# Patient Record
Sex: Male | Born: 1970 | Race: Black or African American | Hispanic: No | Marital: Single | Smoking: Current every day smoker
Health system: Southern US, Community
[De-identification: ages and names within clinical notes are randomized; demographics above are authoritative.]

## PROBLEM LIST (undated history)

## (undated) DIAGNOSIS — I10 Essential (primary) hypertension: Secondary | ICD-10-CM

---

## 2010-09-19 ENCOUNTER — Emergency Department (HOSPITAL_COMMUNITY): Admission: EM | Admit: 2010-09-19 | Discharge: 2010-09-19 | Payer: Self-pay | Admitting: Family Medicine

## 2015-10-07 ENCOUNTER — Encounter (HOSPITAL_COMMUNITY): Payer: Self-pay | Admitting: *Deleted

## 2015-10-07 ENCOUNTER — Emergency Department (HOSPITAL_COMMUNITY)
Admission: EM | Admit: 2015-10-07 | Discharge: 2015-10-07 | Disposition: A | Discharge status: Home / Self Care | Payer: Self-pay | Attending: Emergency Medicine | Admitting: Emergency Medicine

## 2015-10-07 DIAGNOSIS — I10 Essential (primary) hypertension: Secondary | ICD-10-CM | POA: Insufficient documentation

## 2015-10-07 DIAGNOSIS — M25552 Pain in left hip: Secondary | ICD-10-CM | POA: Insufficient documentation

## 2015-10-07 DIAGNOSIS — Z720 Tobacco use: Secondary | ICD-10-CM | POA: Insufficient documentation

## 2015-10-07 DIAGNOSIS — R202 Paresthesia of skin: Secondary | ICD-10-CM | POA: Insufficient documentation

## 2015-10-07 DIAGNOSIS — M5416 Radiculopathy, lumbar region: Secondary | ICD-10-CM | POA: Insufficient documentation

## 2015-10-07 HISTORY — DX: Essential (primary) hypertension: I10

## 2015-10-07 MED ORDER — OXYCODONE-ACETAMINOPHEN 5-325 MG PO TABS
1.0000 | ORAL_TABLET | Freq: Once | ORAL | Status: AC
  Administered 2015-10-07: 1 via ORAL
  Filled 2015-10-07: qty 1

## 2015-10-07 MED ORDER — NAPROXEN 500 MG PO TABS: 500.0000 mg | ORAL_TABLET | Freq: Two times a day (BID) | ORAL | Status: DC

## 2015-10-07 MED ORDER — METHOCARBAMOL 500 MG PO TABS: 500.0000 mg | ORAL_TABLET | Freq: Two times a day (BID) | ORAL | Status: DC

## 2015-10-07 MED ORDER — DEXAMETHASONE SODIUM PHOSPHATE 10 MG/ML IJ SOLN
10.0000 mg | Freq: Once | INTRAMUSCULAR | Status: AC
  Administered 2015-10-07: 10 mg via INTRAMUSCULAR
  Filled 2015-10-07: qty 1

## 2015-10-07 MED ORDER — PREDNISONE 20 MG PO TABS: 40.0000 mg | ORAL_TABLET | Freq: Every day | ORAL | Status: DC

## 2015-10-07 MED ORDER — HYDROCODONE-ACETAMINOPHEN 5-325 MG PO TABS: 1.0000 | ORAL_TABLET | Freq: Four times a day (QID) | ORAL | Status: DC | PRN

## 2015-10-07 NOTE — ED Notes (Signed)
Pt is here with lower back pain for 6months and reports a lump.

## 2015-10-07 NOTE — Discharge Instructions (Signed)
Naprosyn for pain and inflammation. norco for severe pain only. Prednisone until all gone. Robaxin for spasms. Follow up with your primary care doctor.    Lumbosacral Radiculopathy Lumbosacral radiculopathy is a condition that involves the spinal nerves and nerve roots in the low back and bottom of the spine. The condition develops when these nerves and nerve roots move out of place or become inflamed and cause symptoms. CAUSES This condition may be caused by: 1. Pressure from a disk that bulges out of place (herniated disk). A disk is a plate of cartilage that separates bones in the spine. 2. Disk degeneration. 3. A narrowing of the bones of the lower back (spinal stenosis). 4. A tumor. 5. An infection. 6. An injury that places sudden pressure on the disks that cushion the bones of your lower spine. RISK FACTORS This condition is more likely to develop in: 1. Males aged 30-50 years. 2. Females aged 50-60 years. 3. People who lift improperly. 4. People who are overweight or live a sedentary lifestyle. 5. People who smoke. 6. People who perform repetitive activities that strain the spine. SYMPTOMS Symptoms of this condition include: 1. Pain that goes down from the back into the legs (sciatica). This is the most common symptom. The pain may be worse with sitting, coughing, or sneezing. 2. Pain and numbness in the arms and legs. 3. Muscle weakness. 4. Tingling. 5. Loss of bladder control or bowel control. DIAGNOSIS This condition is diagnosed with a physical exam and medical history. If the pain is lasting, you may have tests, such as: 1. MRI scan. 2. X-ray. 3. CT scan. 4. Myelogram. 5. Nerve conduction study. TREATMENT This condition is often treated with: 1. Hot packs and ice applied to affected areas. 2. Stretches to improve flexibility. 3. Exercises to strengthen back muscles. 4. Physical therapy. 5. Pain medicine. 6. A steroid injection in the spine. In some cases, no  treatment is needed. If the condition is long-lasting (chronic), or if symptoms are severe, treatment may involve surgery or lifestyle changes, such as following a weight loss plan. HOME CARE INSTRUCTIONS Medicines 1. Take medicines only as directed by your health care provider. 2. Do not drive or operate heavy machinery while taking pain medicine. Injury Care 1. Apply a heat pack to the injured area as directed by your health care provider. 2. Apply ice to the affected area: 1. Put ice in a plastic bag. 2. Place a towel between your skin and the bag. 3. Leave the ice on for 20-30 minutes, every 2 hours while you are awake or as needed. Or, leave the ice on for as long as directed by your health care provider. Other Instructions  If you were shown how to do any exercises or stretches, do them as directed by your health care provider.  If your health care provider prescribed a diet or exercise program, follow it as directed.  Keep all follow-up visits as directed by your health care provider. This is important. SEEK MEDICAL CARE IF:  Your pain does not improve over time even when taking pain medicines. SEEK IMMEDIATE MEDICAL CARE IF:  Your develop severe pain.  Your pain suddenly gets worse.  You develop increasing weakness in your legs.  You lose the ability to control your bladder or bowel.  You have difficulty walking or balancing.  You have a fever.   This information is not intended to replace advice given to you by your health care provider. Make sure you discuss any questions  you have with your health care provider.   Document Released: 11/24/2005 Document Revised: 04/10/2015 Document Reviewed: 11/20/2014 Elsevier Interactive Patient Education 2016 [employer]  Back Exercises If you have pain in your back, do these exercises 2-3 times each day or as told by your doctor. When the pain goes away, do the exercises once each day, but repeat the steps more times for each  exercise (do more repetitions). If you do not have pain in your back, do these exercises once each day or as told by your doctor. EXERCISES Single Knee to Chest Do these steps 3-5 times in a row for each leg: 7. Lie on your back on a firm bed or the floor with your legs stretched out. 8. Bring one knee to your chest. 9. Hold your knee to your chest by grabbing your knee or thigh. 10. Pull on your knee until you feel a gentle stretch in your lower back. 11. Keep doing the stretch for 10-30 seconds. 12. Slowly let go of your leg and straighten it. Pelvic Tilt Do these steps 5-10 times in a row: 7. Lie on your back on a firm bed or the floor with your legs stretched out. 8. Bend your knees so they point up to the ceiling. Your feet should be flat on the floor. 9. Tighten your lower belly (abdomen) muscles to press your lower back against the floor. This will make your tailbone point up to the ceiling instead of pointing down to your feet or the floor. 10. Stay in this position for 5-10 seconds while you gently tighten your muscles and breathe evenly. Cat-Cow Do these steps until your lower back bends more easily: 6. Get on your hands and knees on a firm surface. Keep your hands under your shoulders, and keep your knees under your hips. You may put padding under your knees. 7. Let your head hang down, and make your tailbone point down to the floor so your lower back is round like the back of a cat. 8. Stay in this position for 5 seconds. 9. Slowly lift your head and make your tailbone point up to the ceiling so your back hangs low (sags) like the back of a cow. 10. Stay in this position for 5 seconds. Press-Ups Do these steps 5-10 times in a row: 6. Lie on your belly (face-down) on the floor. 7. Place your hands near your head, about shoulder-width apart. 8. While you keep your back relaxed and keep your hips on the floor, slowly straighten your arms to raise the top half of your body and lift  your shoulders. Do not use your back muscles. To make yourself more comfortable, you may change where you place your hands. 9. Stay in this position for 5 seconds. 10. Slowly return to lying flat on the floor. Bridges Do these steps 10 times in a row: 7. Lie on your back on a firm surface. 8. Bend your knees so they point up to the ceiling. Your feet should be flat on the floor. 9. Tighten your butt muscles and lift your butt off of the floor until your waist is almost as high as your knees. If you do not feel the muscles working in your butt and the back of your thighs, slide your feet 1-2 inches farther away from your butt. 10. Stay in this position for 3-5 seconds. 11. Slowly lower your butt to the floor, and let your butt muscles relax. If this exercise is too easy, try doing  it with your arms crossed over your chest. Belly Crunches Do these steps 5-10 times in a row: 3. Lie on your back on a firm bed or the floor with your legs stretched out. 4. Bend your knees so they point up to the ceiling. Your feet should be flat on the floor. 5. Cross your arms over your chest. 6. Tip your chin a little bit toward your chest but do not bend your neck. 7. Tighten your belly muscles and slowly raise your chest just enough to lift your shoulder blades a tiny bit off of the floor. 8. Slowly lower your chest and your head to the floor. Back Lifts Do these steps 5-10 times in a row: 3. Lie on your belly (face-down) with your arms at your sides, and rest your forehead on the floor. 4. Tighten the muscles in your legs and your butt. 5. Slowly lift your chest off of the floor while you keep your hips on the floor. Keep the back of your head in line with the curve in your back. Look at the floor while you do this. 6. Stay in this position for 3-5 seconds. 7. Slowly lower your chest and your face to the floor. GET HELP IF:  Your back pain gets a lot worse when you do an exercise.  Your back pain does  not lessen 2 hours after you exercise. If you have any of these problems, stop doing the exercises. Do not do them again unless your doctor says it is okay. GET HELP RIGHT AWAY IF:  You have sudden, very bad back pain. If this happens, stop doing the exercises. Do not do them again unless your doctor says it is okay.   This information is not intended to replace advice given to you by your health care provider. Make sure you discuss any questions you have with your health care provider.   Document Released: 12/27/2010 Document Revised: 08/15/2015 Document Reviewed: 01/18/2015 Elsevier Interactive Patient Education [employer]

## 2015-10-07 NOTE — ED Notes (Signed)
Declined W/C at D/C and was escorted to lobby by RN.Declined W/C at D/C and was escorted to lobby by RN. 

## 2015-10-07 NOTE — ED Provider Notes (Signed)
CSN: [id number]     Arrival date & time 10/07/15  1734 History   First MD Initiated Contact with Patient 10/07/15 1801     Chief Complaint  Patient presents with  . Back Pain     (Consider location/radiation/quality/duration/timing/severity/associated sxs/prior Treatment) HPI Timothy Mcdowell is a 44 y.o. male with hx of htn, presents to ed was complaint of back pain. Patient states his back pain started several years ago. He states it worsened in the last 6 months and again in the last 3 days. He states that he hasn't been able to walk the last 2 days until this morning. He states pain is in the lower back and radiates into the left leg. He denies any numbness or weakness in his legs. He states he does have some tingling in that left leg which improves with walking. He denies any saddle anesthesia. Denies any trouble urinating or loss of bowel control. He denies any recent injuries. He denies any fever or chills. No IV drug use. [place] and is unfamiliar with providers in this area. Patient states he is taking Tylenol for his pain with no relief of his symptoms.=  Past Medical History  Diagnosis Date  . Hypertension    History reviewed. No pertinent past surgical history. No family history on file. Social History  Substance Use Topics  . Smoking status: Current Every Day Smoker  . Smokeless tobacco: None  . Alcohol Use: No    Review of Systems  Constitutional: Negative for fever and chills.  Respiratory: Negative for cough, chest tightness and shortness of breath.   Cardiovascular: Negative for chest pain, palpitations and leg swelling.  Gastrointestinal: Negative for nausea, vomiting, abdominal pain, diarrhea and abdominal distention.  Genitourinary: Negative for dysuria, urgency, frequency and hematuria.  Musculoskeletal: Positive for back pain and arthralgias. Negative for myalgias, neck pain and neck stiffness.  Skin: Negative for rash.  Allergic/Immunologic:  Negative for immunocompromised state.  Neurological: Negative for dizziness, weakness, light-headedness, numbness and headaches.  All other systems reviewed and are negative.     Allergies  Other  Home Medications   Prior to Admission medications   Not on File   BP 153/89 mmHg  Pulse 88  Temp(Src) 98.2 F (36.8 C) (Oral)  Resp 18  SpO2 100% Physical Exam  Constitutional: He is oriented to person, place, and time. He appears well-developed and well-nourished. No distress.  HENT:  Head: Normocephalic and atraumatic.  Eyes: Conjunctivae are normal.  Neck: Neck supple.  Cardiovascular: Normal rate, regular rhythm and normal heart sounds.   Pulmonary/Chest: Effort normal. No respiratory distress. He has no wheezes. He has no rales.  Abdominal: Soft. Bowel sounds are normal. He exhibits no distension. There is no tenderness. There is no rebound.  Musculoskeletal: He exhibits no edema.  No midline lumbar spine tenderness. Tender to palpation left SI joint and left buttock. Pain with left straight leg raise. Dorsal pedal pulses are intact and equal bilaterally.  Neurological: He is alert and oriented to person, place, and time.  5/5 and equal lower extremity strength. 2+ and equal patellar reflexes bilaterally. Pt able to dorsiflex bilateral toes and feet with good strength against resistance. Equal sensation bilaterally over thighs and lower legs.   Skin: Skin is warm and dry.  Nursing note and vitals reviewed.   ED Course  Procedures (including critical care time) Labs Review Labs Reviewed - No data to display  Imaging Review No results found. I have personally reviewed  and evaluated these images and lab results as part of my medical decision-making.   EKG Interpretation None      MDM   Final diagnoses:  Lumbar radiculopathy   Patient with lower back pain radiating into the left leg. Normal neurological exam with no red flags to suggest cauda equina. Most likely  lumbar sacral radiculopathy. Patient is ambulatory. Treated emergency department Decadron 10 mg IM and Percocet one tablet. Will discharge home with Robaxin, Norco, naproxen, prednisone burst. Will give her referrals to community clinic. Return precautions discussed.   Filed Vitals:   10/07/15 1758  BP: 153/89  Pulse: 88  Temp: 98.2 F (36.8 C)  TempSrc: Oral  Resp: 18  SpO2: 100%      Jaynie Crumble, PA-C 10/07/15 2101  Azalia Bilis, MD 10/07/15 2154

## 2015-10-09 ENCOUNTER — Emergency Department (HOSPITAL_COMMUNITY)
Admission: EM | Admit: 2015-10-09 | Discharge: 2015-10-09 | Disposition: A | Discharge status: Home / Self Care | Payer: Self-pay | Attending: Emergency Medicine | Admitting: Emergency Medicine

## 2015-10-09 ENCOUNTER — Encounter (HOSPITAL_COMMUNITY): Payer: Self-pay | Admitting: Emergency Medicine

## 2015-10-09 DIAGNOSIS — Y998 Other external cause status: Secondary | ICD-10-CM | POA: Insufficient documentation

## 2015-10-09 DIAGNOSIS — Y9389 Activity, other specified: Secondary | ICD-10-CM | POA: Insufficient documentation

## 2015-10-09 DIAGNOSIS — S61011A Laceration without foreign body of right thumb without damage to nail, initial encounter: Secondary | ICD-10-CM | POA: Insufficient documentation

## 2015-10-09 DIAGNOSIS — Z7952 Long term (current) use of systemic steroids: Secondary | ICD-10-CM | POA: Insufficient documentation

## 2015-10-09 DIAGNOSIS — W260XXA Contact with knife, initial encounter: Secondary | ICD-10-CM | POA: Insufficient documentation

## 2015-10-09 DIAGNOSIS — T148XXA Other injury of unspecified body region, initial encounter: Secondary | ICD-10-CM

## 2015-10-09 DIAGNOSIS — Z720 Tobacco use: Secondary | ICD-10-CM | POA: Insufficient documentation

## 2015-10-09 DIAGNOSIS — IMO0002 Reserved for concepts with insufficient information to code with codable children: Secondary | ICD-10-CM

## 2015-10-09 DIAGNOSIS — Y9289 Other specified places as the place of occurrence of the external cause: Secondary | ICD-10-CM | POA: Insufficient documentation

## 2015-10-09 DIAGNOSIS — Z791 Long term (current) use of non-steroidal anti-inflammatories (NSAID): Secondary | ICD-10-CM | POA: Insufficient documentation

## 2015-10-09 DIAGNOSIS — I10 Essential (primary) hypertension: Secondary | ICD-10-CM | POA: Insufficient documentation

## 2015-10-09 DIAGNOSIS — Z79899 Other long term (current) drug therapy: Secondary | ICD-10-CM | POA: Insufficient documentation
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
## 2015-10-09 NOTE — Discharge Instructions (Signed)
Please gently clean and dress your wound once a day. Return for signs of infection to include pus, redness, heat, and severe tenderness.

## 2016-05-07 ENCOUNTER — Encounter (HOSPITAL_COMMUNITY): Payer: Self-pay | Admitting: Emergency Medicine

## 2016-05-07 ENCOUNTER — Emergency Department (HOSPITAL_COMMUNITY)
Admission: EM | Admit: 2016-05-07 | Discharge: 2016-05-07 | Disposition: A | Discharge status: Home / Self Care | Payer: Self-pay | Attending: Emergency Medicine | Admitting: Emergency Medicine

## 2016-05-07 ENCOUNTER — Emergency Department (HOSPITAL_COMMUNITY): Payer: Self-pay

## 2016-05-07 DIAGNOSIS — Z79899 Other long term (current) drug therapy: Secondary | ICD-10-CM | POA: Insufficient documentation

## 2016-05-07 DIAGNOSIS — Y929 Unspecified place or not applicable: Secondary | ICD-10-CM | POA: Insufficient documentation

## 2016-05-07 DIAGNOSIS — S61512A Laceration without foreign body of left wrist, initial encounter: Secondary | ICD-10-CM | POA: Insufficient documentation

## 2016-05-07 DIAGNOSIS — I10 Essential (primary) hypertension: Secondary | ICD-10-CM | POA: Insufficient documentation

## 2016-05-07 DIAGNOSIS — Y939 Activity, unspecified: Secondary | ICD-10-CM | POA: Insufficient documentation

## 2016-05-07 DIAGNOSIS — Y999 Unspecified external cause status: Secondary | ICD-10-CM | POA: Insufficient documentation

## 2016-05-07 DIAGNOSIS — Z23 Encounter for immunization: Secondary | ICD-10-CM | POA: Insufficient documentation

## 2016-05-07 DIAGNOSIS — IMO0002 Reserved for concepts with insufficient information to code with codable children: Secondary | ICD-10-CM

## 2016-05-07 DIAGNOSIS — F17200 Nicotine dependence, unspecified, uncomplicated: Secondary | ICD-10-CM | POA: Insufficient documentation

## 2016-05-07 MED ORDER — LIDOCAINE HCL 2 % IJ SOLN
20.0000 mL | Freq: Once | INTRAMUSCULAR | Status: AC
  Administered 2016-05-07: 400 mg via INTRADERMAL
  Filled 2016-05-07: qty 20

## 2016-05-07 MED ORDER — TETANUS-DIPHTH-ACELL PERTUSSIS 5-2.5-18.5 LF-MCG/0.5 IM SUSP
0.5000 mL | Freq: Once | INTRAMUSCULAR | Status: AC
  Administered 2016-05-07: 0.5 mL via INTRAMUSCULAR
  Filled 2016-05-07: qty 0.5

## 2016-05-07 MED ORDER — MORPHINE SULFATE (PF) 4 MG/ML IV SOLN
4.0000 mg | Freq: Once | INTRAVENOUS | Status: AC
  Administered 2016-05-07: 4 mg via INTRAMUSCULAR
  Filled 2016-05-07: qty 1
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
## 2016-05-07 NOTE — Discharge Instructions (Signed)
Your tetanus shot was updated.  We repaired your laceration with 11 sutures, these need to be removed in 10-14 days.  Take ibuprofen or Tylenol for pain.  Return to the ED if you experience fever, increased swelling, redness, or foul smelling drainage from your wound.   Laceration Care, Adult   A laceration is a cut that goes through all of the layers of the skin and into the tissue that is right under the skin. Some lacerations heal on their own. Others need to be closed with stitches (sutures), staples, skin adhesive strips, or skin glue. Proper laceration care minimizes the risk of infection and helps the laceration to heal better.  HOW TO CARE FOR YOUR LACERATION  If sutures or staples were used:  Keep the wound clean and dry.  If you were given a bandage (dressing), you should change it at least one time per day or as told by your health care provider. You should also change it if it becomes wet or dirty.  Keep the wound completely dry for the first 24 hours or as told by your health care provider. After that time, you may shower or bathe. However, make sure that the wound is not soaked in water until after the sutures or staples have been removed.  Clean the wound one time each day or as told by your health care provider:  Wash the wound with soap and water.  Rinse the wound with water to remove all soap.  Pat the wound dry with a clean towel. Do not rub the wound. After cleaning the wound, apply a thin layer of antibiotic ointment as told by your health care provider. This will help to prevent infection and keep the dressing from sticking to the wound.  Have the sutures or staples removed as told by your health care provider. If skin adhesive strips were used:  Keep the wound clean and dry.  If you were given a bandage (dressing), you should change it at least one time per day or as told by your health care provider. You should also change it if it becomes dirty or wet.  Do not get the skin  adhesive strips wet. You may shower or bathe, but be careful to keep the wound dry.  If the wound gets wet, pat it dry with a clean towel. Do not rub the wound.  Skin adhesive strips fall off on their own. You may trim the strips as the wound heals. Do not remove skin adhesive strips that are still stuck to the wound. They will fall off in time. If skin glue was used:  Try to keep the wound dry, but you may briefly wet it in the shower or bath. Do not soak the wound in water, such as by swimming.  After you have showered or bathed, gently pat the wound dry with a clean towel. Do not rub the wound.  Do not do any activities that will make you sweat heavily until the skin glue has fallen off on its own.  Do not apply liquid, cream, or ointment medicine to the wound while the skin glue is in place. Using those may loosen the film before the wound has healed.  If you were given a bandage (dressing), you should change it at least one time per day or as told by your health care provider. You should also change it if it becomes dirty or wet.  If a dressing is placed over the wound, be careful not to  apply tape directly over the skin glue. Doing that may cause the glue to be pulled off before the wound has healed.  Do not pick at the glue. The skin glue usually remains in place for 5-10 days, then it falls off of the skin. General Instructions  Take over-the-counter and prescription medicines only as told by your health care provider.  If you were prescribed an antibiotic medicine or ointment, take or apply it as told by your doctor. Do not stop using it even if your condition improves.  To help prevent scarring, make sure to cover your wound with sunscreen whenever you are outside after stitches are removed, after adhesive strips are removed, or when glue remains in place and the wound is healed. Make sure to wear a sunscreen of at least 30 SPF.  Do not scratch or pick at the wound.  Keep all follow-up  visits as told by your health care provider. This is important.  Check your wound every day for signs of infection. Watch for:  Redness, swelling, or pain.  Fluid, blood, or pus. Raise (elevate) the injured area above the level of your heart while you are sitting or lying down, if possible. SEEK MEDICAL CARE IF:  You received a tetanus shot and you have swelling, severe pain, redness, or bleeding at the injection site.  You have a fever.  A wound that was closed breaks open.  You notice a bad smell coming from your wound or your dressing.  You notice something coming out of the wound, such as wood or glass.  Your pain is not controlled with medicine.  You have increased redness, swelling, or pain at the site of your wound.  You have fluid, blood, or pus coming from your wound.  You notice a change in the color of your skin near your wound.  You need to change the dressing frequently due to fluid, blood, or pus draining from the wound.  You develop a new rash.  You develop numbness around the wound. SEEK IMMEDIATE MEDICAL CARE IF:  You develop severe swelling around the wound.  Your pain suddenly increases and is severe.  You develop painful lumps near the wound or on skin that is anywhere on your body.  You have a red streak going away from your wound.  The wound is on your hand or foot and you cannot properly move a finger or toe.  The wound is on your hand or foot and you notice that your fingers or toes look pale or bluish. This information is not intended to replace advice given to you by your health care provider. Make sure you discuss any questions you have with your health care provider.  Document Released: 11/24/2005 Document Revised: 04/10/2015 Document Reviewed: 11/20/2014  Elsevier Interactive Patient Education [employer]
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
## 2016-05-20 ENCOUNTER — Emergency Department (HOSPITAL_COMMUNITY)
Admission: EM | Admit: 2016-05-20 | Discharge: 2016-05-20 | Disposition: A | Discharge status: Home / Self Care | Payer: Self-pay | Attending: Emergency Medicine | Admitting: Emergency Medicine

## 2016-05-20 ENCOUNTER — Encounter (HOSPITAL_COMMUNITY): Payer: Self-pay

## 2016-05-20 DIAGNOSIS — Z4802 Encounter for removal of sutures: Secondary | ICD-10-CM | POA: Insufficient documentation

## 2016-05-20 DIAGNOSIS — Z79899 Other long term (current) drug therapy: Secondary | ICD-10-CM | POA: Insufficient documentation

## 2016-05-20 DIAGNOSIS — I10 Essential (primary) hypertension: Secondary | ICD-10-CM | POA: Insufficient documentation

## 2016-05-20 DIAGNOSIS — F17200 Nicotine dependence, unspecified, uncomplicated: Secondary | ICD-10-CM | POA: Insufficient documentation

## 2016-05-20 NOTE — ED Notes (Signed)
Patient here to have stitches removed from left hand, no complaints

## 2016-05-20 NOTE — ED Provider Notes (Signed)
CSN: [id number]     Arrival date & time 05/20/16  1237 History   By signing my name below, I, Marisue HumbleMichelle Chaffee, attest that this documentation has been prepared under the direction and in the presence of non-physician practitioner, Cheri FowlerKayla Neveyah Garzon, PA-C. Electronically Signed: Marisue HumbleMichelle Chaffee, Scribe. 05/20/2016. 2:12 PM.    Chief Complaint  Patient presents with  . Suture / Staple Removal    The history is provided by the patient. No language interpreter was used.   HPI Comments:  Timothy Mcdowell is a 45 y.o. male who presents to the Emergency Department for suture removal from laceration to left palm. Laceration was repaired in the ED 13 days ago. He states the wound has been healing well. Pt has been cleaning the wound with peroxide 4-5 times per day. Denies fever, chills, redness, pain, or drainage from wound.   Past Medical History  Diagnosis Date  . Hypertension    History reviewed. No pertinent past surgical history. Family History  Problem Relation Age of Onset  . Hypertension Mother    Social History  Substance Use Topics  . Smoking status: Current Every Day Smoker  . Smokeless tobacco: None  . Alcohol Use: No    Review of Systems  Constitutional: Negative for fever and chills.  Skin: Positive for wound.    Allergies  Other  Home Medications   Prior to Admission medications   Medication Sig Start Date End Date Taking? Authorizing Provider  cholecalciferol (VITAMIN D) 1000 units tablet Take 1,000 Units by mouth daily.    Historical Provider, MD   BP 136/76 mmHg  Pulse 82  Temp(Src) 98 F (36.7 C) (Oral)  Resp 18  SpO2 98%   Physical Exam  Constitutional: He is oriented to person, place, and time. He appears well-developed and well-nourished.  HENT:  Head: Normocephalic and atraumatic.  Right Ear: External ear normal.  Left Ear: External ear normal.  Eyes: Conjunctivae are normal. No scleral icterus.  Neck: No tracheal deviation present.  Cardiovascular:   Pulses:      Radial pulses are 2+ on the right side, and 2+ on the left side.  Brisk cap refill.   Pulmonary/Chest: Effort normal. No respiratory distress.  Abdominal: He exhibits no distension.  Musculoskeletal: Normal range of motion.  Neurological: He is alert and oriented to person, place, and time.  Strength and sensation intact.  Skin: Skin is warm and dry.  Well healing 6 cm laceration without signs of infection on right palm with 11 sutures in place.   Psychiatric: He has a normal mood and affect. His behavior is normal.    ED Course  Procedures   SUTURE REMOVAL Performed by: Cheri FowlerKayla Bernestine Holsapple  Consent: Verbal consent obtained. Patient identity confirmed: provided demographic data Time out: Immediately prior to procedure a "time out" was called to verify the correct patient, procedure, equipment, support staff and site/side marked as required.  Location details: right palm  Wound Appearance: clean  Sutures/Staples Removed: 11  Facility: sutures placed in this facility Patient tolerance: Patient tolerated the procedure well with no immediate complications.    DIAGNOSTIC STUDIES:  Oxygen Saturation is 98% on RA, normal by my interpretation.    COORDINATION OF CARE:  2:07 PM Removed 11 stitches from left hand. Will dress wound with steri strips and discharge. Discussed treatment plan with pt at bedside and pt agreed to plan.  Labs Review Labs Reviewed - No data to display  Imaging Review No results found. I have personally reviewed and  evaluated these images and lab results as part of my medical decision-making.   EKG Interpretation None      MDM   Final diagnoses:  Encounter for removal of sutures   Wound without signs of infection.  Sutures removed.  Steri strips applied.  Advised patient to avoid hydrogen peroxide as this inhibits wound healing.  VSS, NAD.  Discussed return precautions.  Follow up PCP as needed.  Patient agrees and acknowledges the above  plan for discharge.   I personally performed the services described in this documentation, which was scribed in my presence. The recorded information has been reviewed and is accurate.    Cheri Fowler, PA-C 05/20/16 1417  Pricilla Loveless, MD 05/23/16 [phone]

## 2016-05-20 NOTE — ED Notes (Signed)
Suture removal kit at bedside 

## 2016-05-20 NOTE — Discharge Instructions (Signed)

## 2016-05-20 NOTE — ED Notes (Signed)
Velcro splint applied to left wrist at pt's request. Steri strips applied.

## 2016-05-20 NOTE — ED Notes (Signed)
Sutures intact in left hand from 5/31. Small part of wound pulled apart but no signs of infection.

## 2016-08-11 ENCOUNTER — Emergency Department (HOSPITAL_BASED_OUTPATIENT_CLINIC_OR_DEPARTMENT_OTHER)
Admission: EM | Admit: 2016-08-11 | Discharge: 2016-08-11 | Disposition: A | Discharge status: Home / Self Care | Payer: Self-pay | Attending: Emergency Medicine | Admitting: Emergency Medicine

## 2016-08-11 ENCOUNTER — Encounter (HOSPITAL_BASED_OUTPATIENT_CLINIC_OR_DEPARTMENT_OTHER): Payer: Self-pay

## 2016-08-11 DIAGNOSIS — F17200 Nicotine dependence, unspecified, uncomplicated: Secondary | ICD-10-CM | POA: Insufficient documentation

## 2016-08-11 DIAGNOSIS — N5089 Other specified disorders of the male genital organs: Secondary | ICD-10-CM | POA: Insufficient documentation

## 2016-08-11 DIAGNOSIS — L293 Anogenital pruritus, unspecified: Secondary | ICD-10-CM

## 2016-08-11 DIAGNOSIS — I10 Essential (primary) hypertension: Secondary | ICD-10-CM | POA: Insufficient documentation

## 2016-08-11 LAB — URINALYSIS, ROUTINE W REFLEX MICROSCOPIC
GLUCOSE, UA: NEGATIVE mg/dL
Hgb urine dipstick: NEGATIVE
Ketones, ur: 15 mg/dL — AB
LEUKOCYTES UA: NEGATIVE
Nitrite: NEGATIVE
PH: 7 (ref 5.0–8.0)
PROTEIN: NEGATIVE mg/dL
Specific Gravity, Urine: 1.026 (ref 1.005–1.030)

## 2016-08-11 MED ORDER — AZITHROMYCIN 250 MG PO TABS
1000.0000 mg | ORAL_TABLET | Freq: Once | ORAL | Status: AC
  Administered 2016-08-11: 1000 mg via ORAL
  Filled 2016-08-11: qty 4

## 2016-08-11 MED ORDER — PERMETHRIN 1 % EX LOTN: 1.0000 "application " | TOPICAL_LOTION | Freq: Once | CUTANEOUS | 0 refills | Status: AC

## 2016-08-11 MED ORDER — METRONIDAZOLE 500 MG PO TABS
2000.0000 mg | ORAL_TABLET | Freq: Once | ORAL | Status: AC
  Administered 2016-08-11: 2000 mg via ORAL
  Filled 2016-08-11: qty 4

## 2016-08-11 MED ORDER — CEFTRIAXONE SODIUM 250 MG IJ SOLR
250.0000 mg | Freq: Once | INTRAMUSCULAR | Status: AC
  Administered 2016-08-11: 250 mg via INTRAMUSCULAR
  Filled 2016-08-11: qty 250

## 2016-08-11 NOTE — ED Triage Notes (Signed)
C/o groin pain, itching x 4 days-probable STD exposure-NAD-steady gait

## 2016-08-11 NOTE — Discharge Instructions (Signed)
You have been tested for STDs. Some of these results are still pending. Any abnormalities will be called to you. You have been prophylactically treated for gonorrhea, Chlamydia, and Trichomonas. This does not mean you necessarily have these diseases, treatment is precautionary. Be sure to follow safe sex practices, including monogamy and/or condom use.  You may have also been exposed to pubic lice, or "cramps," which may be causing the itching. Use the prescribed medication permethrin to eliminate any infestation. Over-the-counter hydrocortisone cream may also help reduce itching. Follow up with a PCP should symptoms fail to resolve.

## 2016-08-11 NOTE — ED Notes (Signed)
PA at bedside.

## 2016-08-11 NOTE — ED Provider Notes (Signed)
[facility]-EMERGENCY DEPT Provider Note   CSN: [id number] Arrival date & time: 08/11/16  1122     History   Chief Complaint Chief Complaint  Patient presents with  . Groin Pain    HPI Timothy Mcdowell is a 45 y.o. male.  HPI   Timothy Mcdowell is a 45 y.o. male, with a history of HTN, presenting to the ED with Genital itching for the past 2 days. Patient states he had sexual intercourse with a new male partner 5 days ago and she subsequently told him that she tested positive for Trichomonas. Patient states that his genitals began to itch, he began to scratch them, and they then began to hurt. Denies penile discharge, fever/chills, abdominal pain, pain with bowel movements, or any other complaints.    Past Medical History:  Diagnosis Date  . Hypertension     There are no active problems to display for this patient.   History reviewed. No pertinent surgical history.     Home Medications    Prior to Admission medications   Not on File    Family History Family History  Problem Relation Age of Onset  . Hypertension Mother     Social History Social History  Substance Use Topics  . Smoking status: Current Every Day Smoker  . Smokeless tobacco: Never Used  . Alcohol use No     Allergies   Other   Review of Systems Review of Systems  Genitourinary: Negative for discharge, dysuria, penile pain, scrotal swelling and testicular pain.       Genital itching  All other systems reviewed and are negative.    Physical Exam Updated Vital Signs BP 123/98 (BP Location: Left Arm)   Pulse 86   Temp 97.6 F (36.4 C) (Oral)   Resp 16   Ht 5\' 11"  (1.803 m)   Wt 90.7 kg   SpO2 100%   BMI 27.89 kg/m   Physical Exam  Constitutional: He appears well-developed and well-nourished. No distress.  HENT:  Head: Normocephalic and atraumatic.  Eyes: Conjunctivae are normal.  Neck: Neck supple.  Cardiovascular: Normal rate, regular rhythm and intact distal pulses.     Pulmonary/Chest: Effort normal. No respiratory distress.  Abdominal: Soft. There is no tenderness. There is no guarding.  Genitourinary:  Genitourinary Comments: Penis, scrotum, and testicles without swelling, lesions, or tenderness. No penile discharge. Overall normal male genitalia. RN, Joss, served as chaperone during the exam.  Musculoskeletal: He exhibits no edema or tenderness.  Lymphadenopathy:    He has no cervical adenopathy.  Neurological: He is alert.  Skin: Skin is warm and dry. He is not diaphoretic.  Psychiatric: He has a normal mood and affect. His behavior is normal.  Nursing note and vitals reviewed.    ED Treatments / Results  Labs (all labs ordered are listed, but only abnormal results are displayed) Labs Reviewed  URINALYSIS, ROUTINE W REFLEX MICROSCOPIC ([facility]) - Abnormal; Notable for the following:       Result Value   Color, Urine AMBER (*)    Bilirubin Urine SMALL (*)    Ketones, ur 15 (*)    All other components within normal limits  URINE CULTURE  RPR  HIV ANTIBODY (ROUTINE TESTING)  GC/CHLAMYDIA PROBE AMP ([facility]) NOT [facility]    EKG  EKG Interpretation None       Radiology No results found.  Procedures Procedures (including critical care time)  Medications Ordered in ED Medications  cefTRIAXone (ROCEPHIN) injection 250 mg (250  mg Intramuscular Given 08/11/16 1152)  azithromycin (ZITHROMAX) tablet 1,000 mg (1,000 mg Oral Given 08/11/16 1153)  metroNIDAZOLE (FLAGYL) tablet 2,000 mg (2,000 mg Oral Given 08/11/16 1153)     Initial Impression / Assessment and Plan / ED Course  I have reviewed the triage vital signs and the nursing notes.  Pertinent labs & imaging results that were available during my care of the patient were reviewed by me and considered in my medical decision making (see chart for details).  Clinical Course   Patient with questionable exposure to STD. He was explained to the patient and nails typically did not  exhibit symptoms of Trichomonas, however, his exposure to Trichomonas puts him at risk for other STDs. Itching in the pubic area may be due to pubic lice. Safe sex practices and return precautions discussed.    Final Clinical Impressions(s) / ED Diagnoses   Final diagnoses:  Itching of male genitalia    New Prescriptions Discharge Medication List as of 08/11/2016 12:21 PM    START taking these medications   Details  permethrin (PERMETHRIN LICE TREATMENT) 1 % lotion Apply 1 application topically once. Shampoo, rinse and towel dry hair, saturate pubic hair with permethrin. Rinse after 10 min; repeat in 1 week if needed, Starting Mon 08/11/2016, Print         Anselm Pancoast, PA-C 08/12/16 1044    Gwyneth Sprout, MD 08/12/16 1537

## 2016-08-12 LAB — URINE CULTURE: Culture: NO GROWTH

## 2016-08-12 LAB — HIV ANTIBODY (ROUTINE TESTING W REFLEX): HIV Screen 4th Generation wRfx: NONREACTIVE

## 2016-08-12 LAB — RPR: RPR Ser Ql: NONREACTIVE

## 2016-08-12 LAB — GC/CHLAMYDIA PROBE AMP (~~LOC~~) NOT AT ARMC
CHLAMYDIA, DNA PROBE: NEGATIVE
NEISSERIA GONORRHEA: NEGATIVE

## 2017-01-13 ENCOUNTER — Emergency Department (HOSPITAL_COMMUNITY)
Admission: EM | Admit: 2017-01-13 | Discharge: 2017-01-13 | Disposition: A | Discharge status: Home / Self Care | Payer: Self-pay | Attending: Emergency Medicine | Admitting: Emergency Medicine

## 2017-01-13 ENCOUNTER — Encounter (HOSPITAL_COMMUNITY): Payer: Self-pay

## 2017-01-13 DIAGNOSIS — I10 Essential (primary) hypertension: Secondary | ICD-10-CM | POA: Insufficient documentation

## 2017-01-13 DIAGNOSIS — L02416 Cutaneous abscess of left lower limb: Secondary | ICD-10-CM | POA: Insufficient documentation

## 2017-01-13 DIAGNOSIS — L0291 Cutaneous abscess, unspecified: Secondary | ICD-10-CM

## 2017-01-13 DIAGNOSIS — F17200 Nicotine dependence, unspecified, uncomplicated: Secondary | ICD-10-CM | POA: Insufficient documentation

## 2017-01-13 MED ORDER — LIDOCAINE HCL 2 % IJ SOLN
20.0000 mL | Freq: Once | INTRAMUSCULAR | Status: AC
  Administered 2017-01-13: 400 mg via INTRADERMAL
  Filled 2017-01-13: qty 20

## 2017-01-13 MED ORDER — SULFAMETHOXAZOLE-TRIMETHOPRIM 800-160 MG PO TABS: 2.0000 | ORAL_TABLET | Freq: Two times a day (BID) | ORAL | 0 refills | Status: AC

## 2017-01-13 NOTE — ED Notes (Signed)
Small pimple like area to left upper anterior thigh. statges he thinks he was bitten by an insect

## 2017-01-13 NOTE — ED Provider Notes (Signed)
[facility] Provider Note   CSN: [id number] Arrival date & time: 01/13/17  0041     History   Chief Complaint Chief Complaint  Patient presents with  . Insect Bite    HPI Timothy Mcdowell is a 46 y.o. male.  HPI Patient presents to the emergency department with an abscess to the left outer thigh.  Patient states he developed over the 3 days.  States that it was larger several days ago that is now smaller over the last 24 hours.  The patient states that nothing seems to make the condition better or worse.  Patient did not take anything prior to arrival for his symptoms.  Patient denies fever, nausea, vomiting, weakness, dizziness, shortness of breath or syncope Past Medical History:  Diagnosis Date  . Hypertension     There are no active problems to display for this patient.   History reviewed. No pertinent surgical history.     Home Medications    Prior to Admission medications   Not on File    Family History Family History  Problem Relation Age of Onset  . Hypertension Mother     Social History Social History  Substance Use Topics  . Smoking status: Current Every Day Smoker  . Smokeless tobacco: Never Used  . Alcohol use No     Allergies   Other   Review of Systems Review of Systems All other systems negative except as documented in the HPI. All pertinent positives and negatives as reviewed in the HPI.  Physical Exam Updated Vital Signs BP 130/83 (BP Location: Left Arm)   Pulse 91   Temp 98.1 F (36.7 C) (Oral)   Resp 16   Ht 5\' 10"  (1.778 m)   Wt 86.2 kg   SpO2 98%   BMI 27.26 kg/m   Physical Exam  Constitutional: He is oriented to person, place, and time. He appears well-developed and well-nourished. No distress.  HENT:  Head: Normocephalic and atraumatic.  Eyes: Pupils are equal, round, and reactive to light.  Pulmonary/Chest: Effort normal.  Neurological: He is alert and oriented to person, place, and time.  Skin: Skin is  warm and dry.     Psychiatric: He has a normal mood and affect.  Nursing note and vitals reviewed.    ED Treatments / Results  Labs (all labs ordered are listed, but only abnormal results are displayed) Labs Reviewed - No data to display  EKG  EKG Interpretation None       Radiology No results found.  Procedures Procedures (including critical care time)  Medications Ordered in ED Medications  lidocaine (XYLOCAINE) 2 % (with pres) injection 400 mg (not administered)     Initial Impression / Assessment and Plan / ED Course  I have reviewed the triage vital signs and the nursing notes.  Pertinent labs & imaging results that were available during my care of the patient were reviewed by me and considered in my medical decision making (see chart for details).     INCISION AND DRAINAGE Performed by: Carlyle Dolly Consent: Verbal consent obtained. Risks and benefits: risks, benefits and alternatives were discussed Type: abscess  Body area: Left outer anterior thigh, mid  Anesthesia: local infiltration  Incision was made with a scalpel.  Local anesthetic: lidocaine 2% without epinephrine  Anesthetic total: 5 ml  Complexity: complex Blunt dissection to break up loculations  Drainage: purulent  Drainage amount: Moderate   Packing material: None   Patient tolerance: Patient tolerated the procedure well with  no immediate complications.  patient is advised return here for any worsening in the condition.  Told to use warm compresses around the area.  Patient agrees the plan and all questions were answered  Final Clinical Impressions(s) / ED Diagnoses   Final diagnoses:  None    New Prescriptions New Prescriptions   No medications on file     Charlestine NightChristopher Jiovani Mccammon, PA-C 01/13/17 [id number]    Devoria AlbeIva Knapp, MD 01/13/17 [phone]

## 2017-01-13 NOTE — Discharge Instructions (Signed)
Use warm compresses around the area.  Keep the area covered.  You can soak in a warm time with Epsom salts.  Return here as needed for any worsening in your condition

## 2017-01-13 NOTE — ED Triage Notes (Addendum)
Pt endorses insect bite to left upper thigh x 2 days ago with redness and itching. Bite noted to left upper thigh with redness surrounding area. No drainage noted.

## 2017-03-27 ENCOUNTER — Encounter (HOSPITAL_COMMUNITY): Payer: Self-pay | Admitting: Emergency Medicine

## 2017-03-27 ENCOUNTER — Emergency Department (HOSPITAL_COMMUNITY): Payer: Self-pay

## 2017-03-27 DIAGNOSIS — Y929 Unspecified place or not applicable: Secondary | ICD-10-CM | POA: Insufficient documentation

## 2017-03-27 DIAGNOSIS — S301XXA Contusion of abdominal wall, initial encounter: Secondary | ICD-10-CM | POA: Insufficient documentation

## 2017-03-27 DIAGNOSIS — S20211A Contusion of right front wall of thorax, initial encounter: Secondary | ICD-10-CM | POA: Insufficient documentation

## 2017-03-27 DIAGNOSIS — Y939 Activity, unspecified: Secondary | ICD-10-CM | POA: Insufficient documentation

## 2017-03-27 DIAGNOSIS — Y999 Unspecified external cause status: Secondary | ICD-10-CM | POA: Insufficient documentation

## 2017-03-27 DIAGNOSIS — F17200 Nicotine dependence, unspecified, uncomplicated: Secondary | ICD-10-CM | POA: Insufficient documentation

## 2017-03-27 DIAGNOSIS — I10 Essential (primary) hypertension: Secondary | ICD-10-CM | POA: Insufficient documentation
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
## 2017-03-28 ENCOUNTER — Emergency Department (HOSPITAL_COMMUNITY): Payer: Self-pay

## 2017-03-28 ENCOUNTER — Emergency Department (HOSPITAL_COMMUNITY)
Admission: EM | Admit: 2017-03-28 | Discharge: 2017-03-28 | Disposition: A | Discharge status: Home / Self Care | Payer: Self-pay | Attending: Emergency Medicine | Admitting: Emergency Medicine

## 2017-03-28 ENCOUNTER — Encounter (HOSPITAL_COMMUNITY): Payer: Self-pay | Admitting: Radiology

## 2017-03-28 DIAGNOSIS — S301XXA Contusion of abdominal wall, initial encounter: Secondary | ICD-10-CM

## 2017-03-28 DIAGNOSIS — S20211A Contusion of right front wall of thorax, initial encounter: Secondary | ICD-10-CM

## 2017-03-28 LAB — COMPREHENSIVE METABOLIC PANEL
ALBUMIN: 3.8 g/dL (ref 3.5–5.0)
ALT: 17 U/L (ref 17–63)
AST: 28 U/L (ref 15–41)
Alkaline Phosphatase: 54 U/L (ref 38–126)
Anion gap: 8 (ref 5–15)
BUN: 14 mg/dL (ref 6–20)
CHLORIDE: 105 mmol/L (ref 101–111)
CO2: 25 mmol/L (ref 22–32)
CREATININE: 0.96 mg/dL (ref 0.61–1.24)
Calcium: 8.8 mg/dL — ABNORMAL LOW (ref 8.9–10.3)
GFR calc Af Amer: >60 mL/min (ref >60)
Glucose, Bld: 91 mg/dL (ref 65–99)
POTASSIUM: 4 mmol/L (ref 3.5–5.1)
SODIUM: 138 mmol/L (ref 135–145)
Total Bilirubin: 1.2 mg/dL (ref 0.3–1.2)
Total Protein: 6.3 g/dL — ABNORMAL LOW (ref 6.5–8.1)

## 2017-03-28 LAB — CBC WITH DIFFERENTIAL/PLATELET
BASOS PCT: 0 %
Basophils Absolute: 0 10*3/uL (ref 0.0–0.1)
EOS ABS: 0.2 10*3/uL (ref 0.0–0.7)
Eosinophils Relative: 3 %
HCT: 38.8 % — ABNORMAL LOW (ref 39.0–52.0)
Hemoglobin: 13.1 g/dL (ref 13.0–17.0)
LYMPHS ABS: 2 10*3/uL (ref 0.7–4.0)
Lymphocytes Relative: 29 %
MCH: 19.5 pg — ABNORMAL LOW (ref 26.0–34.0)
MCHC: 33.8 g/dL (ref 30.0–36.0)
MCV: 57.7 fL — AB (ref 78.0–100.0)
MONO ABS: 0.5 10*3/uL (ref 0.1–1.0)
Monocytes Relative: 7 %
NEUTROS ABS: 4.2 10*3/uL (ref 1.7–7.7)
Neutrophils Relative %: 61 %
PLATELETS: 147 10*3/uL — AB (ref 150–400)
RBC: 6.72 MIL/uL — AB (ref 4.22–5.81)
RDW: 18 % — AB (ref 11.5–15.5)
WBC: 6.9 10*3/uL (ref 4.0–10.5)

## 2017-03-28 MED ORDER — IOPAMIDOL (ISOVUE-300) INJECTION 61%
INTRAVENOUS | Status: AC
  Administered 2017-03-28: 100 mL
  Filled 2017-03-28: qty 100

## 2017-03-28 MED ORDER — TRAMADOL HCL 50 MG PO TABS: 50.0000 mg | ORAL_TABLET | Freq: Four times a day (QID) | ORAL | 0 refills | Status: DC | PRN

## 2017-03-28 MED ORDER — ONDANSETRON HCL 4 MG/2ML IJ SOLN
4.0000 mg | Freq: Once | INTRAMUSCULAR | Status: AC
  Administered 2017-03-28: 4 mg via INTRAVENOUS
  Filled 2017-03-28: qty 2

## 2017-03-28 MED ORDER — IBUPROFEN 800 MG PO TABS: 800.0000 mg | ORAL_TABLET | Freq: Three times a day (TID) | ORAL | 0 refills | Status: DC

## 2017-03-28 MED ORDER — SODIUM CHLORIDE 0.9 % IV BOLUS (SEPSIS)
1000.0000 mL | Freq: Once | INTRAVENOUS | Status: AC
  Administered 2017-03-28: 1000 mL via INTRAVENOUS

## 2017-03-28 MED ORDER — MORPHINE SULFATE (PF) 4 MG/ML IV SOLN
4.0000 mg | Freq: Once | INTRAVENOUS | Status: AC
  Administered 2017-03-28: 4 mg via INTRAVENOUS
  Filled 2017-03-28: qty 1
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
## 2017-03-28 NOTE — ED Notes (Signed)
Pt stable, ambulatory, states understanding of discharge instructions 

## 2017-03-28 NOTE — ED Notes (Signed)
Patient transported to CT 
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
## 2022-03-12 ENCOUNTER — Ambulatory Visit (HOSPITAL_COMMUNITY)
Admission: EM | Admit: 2022-03-12 | Discharge: 2022-03-12 | Disposition: A | Discharge status: Home / Self Care | Payer: Self-pay | Attending: Emergency Medicine | Admitting: Emergency Medicine

## 2022-03-12 ENCOUNTER — Encounter (HOSPITAL_COMMUNITY): Payer: Self-pay | Admitting: Emergency Medicine

## 2022-03-12 DIAGNOSIS — R739 Hyperglycemia, unspecified: Secondary | ICD-10-CM | POA: Insufficient documentation

## 2022-03-12 DIAGNOSIS — R21 Rash and other nonspecific skin eruption: Secondary | ICD-10-CM | POA: Insufficient documentation

## 2022-03-12 DIAGNOSIS — L03115 Cellulitis of right lower limb: Secondary | ICD-10-CM | POA: Insufficient documentation

## 2022-03-12 DIAGNOSIS — Z113 Encounter for screening for infections with a predominantly sexual mode of transmission: Secondary | ICD-10-CM | POA: Insufficient documentation

## 2022-03-12 DIAGNOSIS — L03116 Cellulitis of left lower limb: Secondary | ICD-10-CM | POA: Insufficient documentation

## 2022-03-12 DIAGNOSIS — B353 Tinea pedis: Secondary | ICD-10-CM | POA: Insufficient documentation

## 2022-03-12 LAB — HIV ANTIBODY (ROUTINE TESTING W REFLEX): HIV Screen 4th Generation wRfx: NONREACTIVE

## 2022-03-12 LAB — CBG MONITORING, ED: Glucose-Capillary: 134 mg/dL — ABNORMAL HIGH (ref 70–99)

## 2022-03-12 MED ORDER — FLUCONAZOLE 150 MG PO TABS: 150.0000 mg | ORAL_TABLET | Freq: Once | ORAL | 0 refills | Status: AC

## 2022-03-12 MED ORDER — CLOTRIMAZOLE 1 % EX CREA: TOPICAL_CREAM | CUTANEOUS | 0 refills | Status: DC

## 2022-03-12 MED ORDER — DOXYCYCLINE HYCLATE 100 MG PO CAPS: 100.0000 mg | ORAL_CAPSULE | Freq: Two times a day (BID) | ORAL | 0 refills | Status: AC

## 2022-03-12 NOTE — ED Triage Notes (Signed)
Pt reports had issues with left knee swelling from playing basketball over years since age 51.  ?Rash on bilat feet for about 5 days that is itching.  ?Pt wanting STD testing, reports dysuria at times. Denies discharge or exposure to STDs.  ?

## 2022-03-12 NOTE — ED Provider Notes (Signed)
Blood and urine ?[facility] ? ? ?MRN: [id number] DOB: 09/03/1971 ? ?Subjective:  ? ?Chief Complaint;  ?Chief Complaint  ?Patient presents with  ? Joint Swelling  ? Rash  ? ? ?Lanier Millon is a 51 y.o. male presenting for itchy rash to bilateral feet.  Denies fever or chronic illness his brother does have history of diabetes.  Patient would also like to be tested for sexually transmitted diseases.  He denies fever or dysuria ? ?No current facility-administered medications for this encounter. ? ?Current Outpatient Medications:  ?  ibuprofen (ADVIL,MOTRIN) 800 MG tablet, Take 1 tablet (800 mg total) by mouth 3 (three) times daily., Disp: 21 tablet, Rfl: 0 ?  traMADol (ULTRAM) 50 MG tablet, Take 1 tablet (50 mg total) by mouth every 6 (six) hours as needed., Disp: 15 tablet, Rfl: 0  ? ?Allergies  ?Allergen Reactions  ? Other Rash  ?  strawberries  ? ? ?Past Medical History:  ?Diagnosis Date  ? Hypertension   ?  ? ?Review of Systems  ?Skin:  Positive for rash.  ?All other systems reviewed and are negative. ? ? ?Objective:  ? ?Vitals: ?BP (!) 154/67 (BP Location: Left Arm)   Pulse 81   Temp 98.7 ?F (37.1 ?C) (Oral)   Resp 17   SpO2 98%  ? ?Physical Exam ?Vitals and nursing note reviewed.  ?Constitutional:   ?   General: He is not in acute distress. ?   Appearance: He is well-developed.  ?HENT:  ?   Head: Normocephalic and atraumatic.  ?Eyes:  ?   Conjunctiva/sclera: Conjunctivae normal.  ?Cardiovascular:  ?   Rate and Rhythm: Normal rate and regular rhythm.  ?   Heart sounds: No murmur heard. ?Pulmonary:  ?   Effort: Pulmonary effort is normal. No respiratory distress.  ?   Breath sounds: Normal breath sounds.  ?Abdominal:  ?   Palpations: Abdomen is soft.  ?   Tenderness: There is no abdominal tenderness.  ?Musculoskeletal:     ?   General: No swelling.  ?   Cervical back: Neck supple.  ?Skin: ?   General: Skin is warm and dry.  ?   Capillary Refill: Capillary refill takes less than 2 seconds.  ?    Findings: Lesion present.  ?   Comments: Excoriated skin dorsal distal aspect bilat feet 69mm flesh colored bumps between toes- and lateral aspect of bilat feet without skin breakdown- no nvdd- mild local swelling  ?Neurological:  ?   Mental Status: He is alert.  ?Psychiatric:     ?   Mood and Affect: Mood normal.  ? ? ?Results for orders placed or performed during the hospital encounter of 03/12/22 (from the past 24 hour(s))  ?POC CBG monitoring     Status: Abnormal  ? Collection Time: 03/12/22  2:33 PM  ?Result Value Ref Range  ? Glucose-Capillary 134 (H) 70 - 99 mg/dL  ? ? ?No results found.  ?  ? ?Assessment and Plan :  ? ?1. Rash   ?2. Tinea pedis of both feet   ?3. Cellulitis of both feet   ?4. Elevated blood sugar   ?5. Encounter for screening for infections with predominantly sexual mode of transmission   ? ? ?No orders of the defined types were placed in this encounter. ? ? ?MDM:  ?Arnie Clingenpeel is a 51 y.o. male presenting for itchy rash to bilateral feet over the last couple of weeks he denies past medical history, brother  is a diabetic.  Goal exam reveals an excoriated rash to bilateral distal dorsal aspect and lateral aspect of feet with some yellowish discharge between the toes which likely represents early infection.  Patient's blood sugar was 134.  I prescribed both antibiotic and antifungal along with antifungal cream and encouraged regular changing of his shoes and socks and further treatment follow-up with his primary care provider.  Patient was also tested for STIs both/urethral swab he denied symptoms but his last unprotected sexual encounter was 2 months ago.  I discussed treatment, follow up and return instructions. Questions were answered. Patient stated understanding of instructions and is stable for discharge. ? ?Dewaine Conger FNP-C [facility]  ?  ?Jone Baseman, NP ?03/12/22 1452 ? ?

## 2022-03-12 NOTE — Discharge Instructions (Addendum)
Use local cream as discussed and add antifungal pills as directed as well as short courses of antibiotics.  Change socks regularly.  You may also need to change your shoes as they may be a source of fungal infection.  Return if not improving or worse at any time for reevaluation ?Your blood sugar today was elvated indicating a likely hood of the onset of diabetes.  The elevated blood sugar can cause more fungal infections.  Follow-up with your primary care provider for further diagnosis and treatment.  You will be called with the results of your tests for sexually transmitted diseases when received ?

## 2022-03-13 LAB — CYTOLOGY, (ORAL, ANAL, URETHRAL) ANCILLARY ONLY
Chlamydia: NEGATIVE
Comment: NEGATIVE
Comment: NORMAL
Neisseria Gonorrhea: NEGATIVE

## 2022-03-13 LAB — RPR: RPR Ser Ql: NONREACTIVE

## 2022-06-03 ENCOUNTER — Other Ambulatory Visit: Payer: Self-pay

## 2022-06-03 ENCOUNTER — Encounter (HOSPITAL_COMMUNITY): Payer: Self-pay | Admitting: Emergency Medicine

## 2022-06-03 ENCOUNTER — Emergency Department (HOSPITAL_COMMUNITY): Payer: Self-pay

## 2022-06-03 ENCOUNTER — Emergency Department (HOSPITAL_COMMUNITY)
Admission: EM | Admit: 2022-06-03 | Discharge: 2022-06-03 | Disposition: A | Discharge status: Home / Self Care | Payer: Self-pay | Attending: Emergency Medicine | Admitting: Emergency Medicine

## 2022-06-03 DIAGNOSIS — F17200 Nicotine dependence, unspecified, uncomplicated: Secondary | ICD-10-CM | POA: Insufficient documentation

## 2022-06-03 DIAGNOSIS — R600 Localized edema: Secondary | ICD-10-CM | POA: Insufficient documentation

## 2022-06-03 DIAGNOSIS — R0789 Other chest pain: Secondary | ICD-10-CM | POA: Insufficient documentation

## 2022-06-03 DIAGNOSIS — R079 Chest pain, unspecified: Secondary | ICD-10-CM

## 2022-06-03 DIAGNOSIS — M25512 Pain in left shoulder: Secondary | ICD-10-CM | POA: Insufficient documentation

## 2022-06-03 DIAGNOSIS — I10 Essential (primary) hypertension: Secondary | ICD-10-CM | POA: Insufficient documentation

## 2022-06-03 DIAGNOSIS — M546 Pain in thoracic spine: Secondary | ICD-10-CM | POA: Insufficient documentation

## 2022-06-03 LAB — BASIC METABOLIC PANEL
Anion gap: 11 (ref 5–15)
BUN: 16 mg/dL (ref 6–20)
CO2: 22 mmol/L (ref 22–32)
Calcium: 9 mg/dL (ref 8.9–10.3)
Chloride: 107 mmol/L (ref 98–111)
Creatinine, Ser: 0.95 mg/dL (ref 0.61–1.24)
GFR, Estimated: >60 mL/min (ref >60)
Glucose, Bld: 98 mg/dL (ref 70–99)
Potassium: 3.6 mmol/L (ref 3.5–5.1)
Sodium: 140 mmol/L (ref 135–145)

## 2022-06-03 LAB — CBC
HCT: 39.8 % (ref 39.0–52.0)
Hemoglobin: 13.1 g/dL (ref 13.0–17.0)
MCH: 19.8 pg — ABNORMAL LOW (ref 26.0–34.0)
MCHC: 32.9 g/dL (ref 30.0–36.0)
MCV: 60.1 fL — ABNORMAL LOW (ref 80.0–100.0)
Platelets: 153 10*3/uL (ref 150–400)
RBC: 6.62 MIL/uL — ABNORMAL HIGH (ref 4.22–5.81)
RDW: 19.2 % — ABNORMAL HIGH (ref 11.5–15.5)
WBC: 6.8 10*3/uL (ref 4.0–10.5)
nRBC: 0 % (ref 0.0–0.2)

## 2022-06-03 LAB — TROPONIN I (HIGH SENSITIVITY): Troponin I (High Sensitivity): 4 ng/L (ref <18)

## 2022-06-03 MED ORDER — PREDNISONE 10 MG (21) PO TBPK: ORAL_TABLET | Freq: Every day | ORAL | 0 refills | Status: DC

## 2022-06-03 MED ORDER — CYCLOBENZAPRINE HCL 10 MG PO TABS: 10.0000 mg | ORAL_TABLET | Freq: Two times a day (BID) | ORAL | 0 refills | Status: DC | PRN

## 2022-06-03 MED ORDER — LIDOCAINE 5 % EX PTCH
1.0000 | MEDICATED_PATCH | CUTANEOUS | Status: DC
  Administered 2022-06-03: 1 via TRANSDERMAL
  Filled 2022-06-03: qty 1

## 2022-11-19 ENCOUNTER — Emergency Department (HOSPITAL_COMMUNITY)
Admission: EM | Admit: 2022-11-19 | Discharge: 2022-11-19 | Disposition: A | Discharge status: Home / Self Care | Payer: Medicaid Other | Attending: Emergency Medicine | Admitting: Emergency Medicine

## 2022-11-19 ENCOUNTER — Other Ambulatory Visit: Payer: Self-pay

## 2022-11-19 ENCOUNTER — Encounter (HOSPITAL_COMMUNITY): Payer: Self-pay

## 2022-11-19 DIAGNOSIS — L989 Disorder of the skin and subcutaneous tissue, unspecified: Secondary | ICD-10-CM | POA: Diagnosis not present

## 2022-11-19 DIAGNOSIS — Z23 Encounter for immunization: Secondary | ICD-10-CM | POA: Insufficient documentation

## 2022-11-19 DIAGNOSIS — M79671 Pain in right foot: Secondary | ICD-10-CM | POA: Diagnosis present

## 2022-11-19 LAB — CBC WITH DIFFERENTIAL/PLATELET
Abs Immature Granulocytes: 0.04 10*3/uL (ref 0.00–0.07)
Basophils Absolute: 0 10*3/uL (ref 0.0–0.1)
Basophils Relative: 1 %
Eosinophils Absolute: 0.4 10*3/uL (ref 0.0–0.5)
Eosinophils Relative: 4 %
HCT: 39.5 % (ref 39.0–52.0)
Hemoglobin: 13 g/dL (ref 13.0–17.0)
Immature Granulocytes: 1 %
Lymphocytes Relative: 19 %
Lymphs Abs: 1.5 10*3/uL (ref 0.7–4.0)
MCH: 19.5 pg — ABNORMAL LOW (ref 26.0–34.0)
MCHC: 32.9 g/dL (ref 30.0–36.0)
MCV: 59.2 fL — ABNORMAL LOW (ref 80.0–100.0)
Monocytes Absolute: 0.7 10*3/uL (ref 0.1–1.0)
Monocytes Relative: 8 %
Neutro Abs: 5.6 10*3/uL (ref 1.7–7.7)
Neutrophils Relative %: 67 %
Platelets: 183 10*3/uL (ref 150–400)
RBC: 6.67 MIL/uL — ABNORMAL HIGH (ref 4.22–5.81)
RDW: 19.5 % — ABNORMAL HIGH (ref 11.5–15.5)
WBC: 8.2 10*3/uL (ref 4.0–10.5)
nRBC: 0 % (ref 0.0–0.2)

## 2022-11-19 LAB — COMPREHENSIVE METABOLIC PANEL
ALT: 29 U/L (ref 0–44)
AST: 31 U/L (ref 15–41)
Albumin: 3.6 g/dL (ref 3.5–5.0)
Alkaline Phosphatase: 70 U/L (ref 38–126)
Anion gap: 8 (ref 5–15)
BUN: 21 mg/dL — ABNORMAL HIGH (ref 6–20)
CO2: 24 mmol/L (ref 22–32)
Calcium: 8.8 mg/dL — ABNORMAL LOW (ref 8.9–10.3)
Chloride: 107 mmol/L (ref 98–111)
Creatinine, Ser: 0.98 mg/dL (ref 0.61–1.24)
GFR, Estimated: >60 mL/min (ref >60)
Glucose, Bld: 116 mg/dL — ABNORMAL HIGH (ref 70–99)
Potassium: 3.8 mmol/L (ref 3.5–5.1)
Sodium: 139 mmol/L (ref 135–145)
Total Bilirubin: 0.7 mg/dL (ref 0.3–1.2)
Total Protein: 6.2 g/dL — ABNORMAL LOW (ref 6.5–8.1)

## 2022-11-19 MED ORDER — CIPROFLOXACIN HCL 500 MG PO TABS
500.0000 mg | ORAL_TABLET | Freq: Once | ORAL | Status: AC
  Administered 2022-11-19: 500 mg via ORAL
  Filled 2022-11-19: qty 1

## 2022-11-19 MED ORDER — KETOROLAC TROMETHAMINE 15 MG/ML IJ SOLN
15.0000 mg | Freq: Once | INTRAMUSCULAR | Status: AC
  Administered 2022-11-19: 15 mg via INTRAMUSCULAR
  Filled 2022-11-19: qty 1

## 2022-11-19 MED ORDER — CEPHALEXIN 500 MG PO CAPS: 500.0000 mg | ORAL_CAPSULE | Freq: Two times a day (BID) | ORAL | 0 refills | Status: AC

## 2022-11-19 MED ORDER — TETANUS-DIPHTH-ACELL PERTUSSIS 5-2.5-18.5 LF-MCG/0.5 IM SUSY
0.5000 mL | PREFILLED_SYRINGE | Freq: Once | INTRAMUSCULAR | Status: AC
  Administered 2022-11-19: 0.5 mL via INTRAMUSCULAR
  Filled 2022-11-19: qty 0.5

## 2022-11-19 NOTE — ED Triage Notes (Signed)
Pt states that he stepped on a nail 4 days ago and now has pain in his foot

## 2022-11-19 NOTE — Discharge Instructions (Addendum)
As discussed, with consideration of your possible puncture wound, and your skin changes, you are starting an antibiotic.  There is some suspicion that your body also has a dermatologic condition that requires outpatient follow-up.  Please use the provided information to schedule follow-up at our clinic and discuss your symptoms and arrange appropriate ongoing care.  Return here for concerning changes in your condition.
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
## 2022-11-19 NOTE — ED Provider Triage Note (Signed)
Emergency Medicine Provider Triage Evaluation Note  Timothy Mcdowell , a 51 y.o. male  was evaluated in triage.  Pt complains of bilateral foot pain.  He stepped on a nail 4 days ago with right heel.  Reports issues with his feet for months, getting worse.  He reports "rash" to feet but has started cracking/bleeding/draining fluids. He denies numbness.  Denies hx of DM.  Review of Systems  Positive: Foot pain, rash Negative: fever  Physical Exam  BP (!) 152/92 (BP Location: Right Arm)   Pulse 80   Temp 97.9 F (36.6 C)   Resp 18   SpO2 97%  Gen:   Awake, no distress   Resp:  Normal effort  MSK:   Moves extremities without difficulty  Other:  Rash noted to bilateral feet, worse along dorsal right foot, appears to have some drainage/blood noted to the socks, DP pulse intact, no induration present, no wounds noted to sole of foot  Medical Decision Making  Medically screening exam initiated at 12:59 AM.  Appropriate orders placed.  Gorge Almanza was informed that the remainder of the evaluation will be completed by another provider, this initial triage assessment does not replace that evaluation, and the importance of remaining in the ED until their evaluation is complete.  Foot pain.  Appears to have some chronic skin findings, acutely worsening.  ? Venous stasis dermatitis.  DP pulses intact.  Will check labs, will need updated tetanus.    Garlon Hatchet, PA-C 11/19/22 [phone]

## 2022-11-26 ENCOUNTER — Encounter: Payer: Self-pay | Admitting: Nurse Practitioner

## 2022-11-26 ENCOUNTER — Ambulatory Visit (INDEPENDENT_AMBULATORY_CARE_PROVIDER_SITE_OTHER): Payer: Medicaid Other | Admitting: Nurse Practitioner

## 2022-11-26 VITALS — BP 139/75 | HR 86 | Temp 97.8°F | Ht 70.0 in | Wt 174.8 lb

## 2022-11-26 DIAGNOSIS — Z131 Encounter for screening for diabetes mellitus: Secondary | ICD-10-CM | POA: Diagnosis not present

## 2022-11-26 DIAGNOSIS — Z1211 Encounter for screening for malignant neoplasm of colon: Secondary | ICD-10-CM | POA: Diagnosis not present

## 2022-11-26 DIAGNOSIS — L299 Pruritus, unspecified: Secondary | ICD-10-CM | POA: Diagnosis not present

## 2022-11-26 DIAGNOSIS — F32A Depression, unspecified: Secondary | ICD-10-CM | POA: Insufficient documentation

## 2022-11-26 DIAGNOSIS — Z09 Encounter for follow-up examination after completed treatment for conditions other than malignant neoplasm: Secondary | ICD-10-CM

## 2022-11-26 DIAGNOSIS — R21 Rash and other nonspecific skin eruption: Secondary | ICD-10-CM | POA: Diagnosis not present

## 2022-11-26 DIAGNOSIS — Z716 Tobacco abuse counseling: Secondary | ICD-10-CM | POA: Insufficient documentation

## 2022-11-26 DIAGNOSIS — R600 Localized edema: Secondary | ICD-10-CM

## 2022-11-26 DIAGNOSIS — M79671 Pain in right foot: Secondary | ICD-10-CM

## 2022-11-26 DIAGNOSIS — F320 Major depressive disorder, single episode, mild: Secondary | ICD-10-CM

## 2022-11-26 LAB — POCT GLYCOSYLATED HEMOGLOBIN (HGB A1C): Hemoglobin A1C: 5.1 % (ref 4.0–5.6)

## 2022-11-26 MED ORDER — CLOTRIMAZOLE 1 % EX CREA: TOPICAL_CREAM | CUTANEOUS | 0 refills | Status: DC

## 2022-11-26 MED ORDER — TRIAMCINOLONE ACETONIDE 0.1 % EX CREA: 1.0000 | TOPICAL_CREAM | Freq: Two times a day (BID) | CUTANEOUS | 1 refills | Status: DC

## 2022-11-26 MED ORDER — POTASSIUM CHLORIDE CRYS ER 20 MEQ PO TBCR: 20.0000 meq | EXTENDED_RELEASE_TABLET | Freq: Every day | ORAL | 0 refills | Status: DC | PRN

## 2022-11-26 MED ORDER — HYDROXYZINE PAMOATE 25 MG PO CAPS: 25.0000 mg | ORAL_CAPSULE | Freq: Three times a day (TID) | ORAL | 0 refills | Status: DC | PRN

## 2022-11-26 MED ORDER — FUROSEMIDE 20 MG PO TABS: 20.0000 mg | ORAL_TABLET | Freq: Every day | ORAL | 0 refills | Status: DC | PRN

## 2022-11-26 MED ORDER — PREDNISONE 10 MG (21) PO TBPK: ORAL_TABLET | Freq: Every day | ORAL | 0 refills | Status: DC

## 2022-11-26 NOTE — Assessment & Plan Note (Signed)
    11/26/2022    1:16 PM  Depression screen PHQ 2/9  Decreased Interest 0  Down, Depressed, Hopeless 2  PHQ - 2 Score 2  Altered sleeping 3  Tired, decreased energy 2  Change in appetite 0  Feeling bad or failure about yourself  1  Trouble concentrating 0  Moving slowly or fidgety/restless 3  Suicidal thoughts 0  PHQ-9 Score 11  Difficult doing work/chores Somewhat difficult   He denies SI, HI  Will refer to LCSW for counseling and to assist with application for health insurance

## 2022-11-26 NOTE — Assessment & Plan Note (Signed)
Hydroxyzine 25 mg every 8 hours as needed ordered.  Lotrimin 1% cream apply to affected area 2 times daily.  Take prednisone 10 mg as ordered

## 2022-11-26 NOTE — Assessment & Plan Note (Signed)
Lasix 20 mg daily as needed ordered take with potassium 20 mEq. Keep legs elevated when sitting to help decrease swelling.

## 2022-11-26 NOTE — Assessment & Plan Note (Addendum)
-   predniSONE (STERAPRED UNI-PAK 21 TAB) 10 MG (21) TBPK tablet; Take by mouth daily. Take 6 tabs by mouth daily  for 2 days, then 5 tabs for 2 days, then 4 tabs for 2 days, then 3 tabs for 2 days, 2 tabs for 2 days, then 1 tab by mouth daily for 2 days  Dispense: 42 tablet; Refill: 0 Lasix 20 mg daily as needed ordered for edema Continue over-the-counter Tylenol or ibuprofen as needed for pain Patient referred to podiatry . Walker provided  the patient today to assist with ambulation -

## 2022-11-26 NOTE — Progress Notes (Signed)
New Patient Office Visit  Subjective:  Patient ID: Timothy Mcdowell, male    DOB: 12-Apr-1971  Age: 51 y.o. MRN: [id number]  CC: No chief complaint on file.   HPI Timothy Mcdowell is a 51 y.o. male with past medical history of tobacco abuse, rashes, lumbar radiculopathy presents for follow-up for visit to the emergency room on 11/19/2022 for complaints of right foot pain and skin changes in both ankles feet elbows groin and buttock.  Patient stated that he had stepped on a nail few days before presenting to the emergency room.  The skin changes he mentions started after stepping on a nail.  Patient denies fever, chills, shortness of breath, chest pain.  Currently has aching throbbing pain rated 6/10 on his right foot.  He has been taking ibuprofen as needed.  He has completed full course of Cipro floxacillin ordered at emergency room.  He is requesting for a walker for ambulation today .   Current tobacco smoker smokes 1 pack of cigarettes every 2 days patient denies shortness of breath, wheezing, cough.  Depression.  Patient stated that he is depressed from his skin condition he denies SI, HI.     Past Medical History:  Diagnosis Date   Hypertension     History reviewed. No pertinent surgical history.  Family History  Problem Relation Age of Onset   Hypertension Mother     Social History   Socioeconomic History   Marital status: Single    Spouse name: Not on file   Number of children: 1   Years of education: Not on file   Highest education level: Not on file  Occupational History   Not on file  Tobacco Use   Smoking status: Every Day   Smokeless tobacco: Never   Tobacco comments:    A pack last every 2 days , age 52 .   Substance and Sexual Activity   Alcohol use: No   Drug use: Yes    Types: Marijuana   Sexual activity: Not on file  Other Topics Concern   Not on file  Social History Narrative   Lives with a friend   Social Determinants of Health   Financial  Resource Strain: Not on file  Food Insecurity: Not on file  Transportation Needs: Not on file  Physical Activity: Not on file  Stress: Not on file  Social Connections: Not on file  Intimate Partner Violence: Not on file    ROS Review of Systems  Constitutional:  Negative for activity change, chills, diaphoresis, fatigue and fever.  Respiratory:  Negative for choking, chest tightness, shortness of breath and wheezing.   Cardiovascular:  Positive for leg swelling. Negative for chest pain and palpitations.  Gastrointestinal:  Negative for abdominal distention, abdominal pain and anal bleeding.  Musculoskeletal:  Positive for arthralgias, gait problem and joint swelling.  Skin:  Positive for color change and rash.  Neurological:  Negative for dizziness, seizures, facial asymmetry, light-headedness, numbness and headaches.  Psychiatric/Behavioral:  Negative for behavioral problems, confusion, self-injury, sleep disturbance and suicidal ideas.     Objective:   Today's Vitals: BP 139/75   Pulse 86   Temp 97.8 F (36.6 C)   Ht 5\' 10"  (1.778 m)   Wt 174 lb 12.8 oz (79.3 kg)   SpO2 100%   BMI 25.08 kg/m   Physical Exam Constitutional:      General: He is not in acute distress.    Appearance: He is not ill-appearing, toxic-appearing or diaphoretic.  Eyes:  General: No scleral icterus.       Right eye: No discharge.        Left eye: No discharge.     Extraocular Movements: Extraocular movements intact.  Cardiovascular:     Rate and Rhythm: Normal rate and regular rhythm.     Pulses: Normal pulses.     Heart sounds: Normal heart sounds. No murmur heard.    No friction rub. No gallop.  Pulmonary:     Effort: Pulmonary effort is normal. No respiratory distress.     Breath sounds: Normal breath sounds. No stridor. No wheezing, rhonchi or rales.  Chest:     Chest wall: No tenderness.  Abdominal:     General: There is no distension.     Palpations: Abdomen is soft.      Tenderness: There is no abdominal tenderness. There is no guarding.  Skin:    General: Skin is warm.     Findings: Rash present.     Comments: thickened scaly itching plaques, noted on bilateral arms, groin area, sacrum area, bilateral lower extremities.  Nonpitting edema to bilateral lower extremities extending towards the knees.  Skin is warm, moist.  No evidence punctured wound noted.  Nails of bilateral toes appear thickened and discolored.   Neurological:     Mental Status: He is alert and oriented to person, place, and time.     Gait: Gait abnormal.  Psychiatric:        Mood and Affect: Mood normal.        Behavior: Behavior normal.        Thought Content: Thought content normal.        Judgment: Judgment normal.     Assessment & Plan:   Problem List Items Addressed This Visit       Musculoskeletal and Integument   Rash and other nonspecific skin eruption - Primary    Seems to be a chronic condition. Was encouraged to avoid scratching the site to prevent infection  kenalog 1 % cream; Apply to affected area 2 times daily - predniSONE (STERAPRED UNI-PAK 21 TAB) 10 MG (21) TBPK tablet; Take by mouth daily. Take 6 tabs by mouth daily  for 2 days, then 5 tabs for 2 days, then 4 tabs for 2 days, then 3 tabs for 2 days, 2 tabs for 2 days, then 1 tab by mouth daily for 2 days  Dispense: 42 tablet; Refill: 0  - hydrOXYzine (VISTARIL) 25 MG capsule; Take 1 capsule (25 mg total) by mouth every 8 (eight) hours as needed for itching.  Dispense: 30 capsule; Refill: 0 - Ambulatory referral to Dermatology      Relevant Medications   predniSONE (STERAPRED UNI-PAK 21 TAB) 10 MG (21) TBPK tablet   triamcinolone cream (KENALOG) 0.1 %   Other Relevant Orders   Ambulatory referral to Dermatology     Other   Tobacco abuse counseling    Smokes about 0.5 pack/day  Asked about quitting: confirms that he/she currently smokes cigarettes Advise to quit smoking: Educated about QUITTING to reduce  the risk of cancer, cardio and cerebrovascular disease. Assess willingness: Unwilling to quit at this time, but is working on cutting back. Assist with counseling and pharmacotherapy: Counseled for 5 minutes and literature provided. Arrange for follow up: follow up in 1 months and continue to offer help.       Itching    Hydroxyzine 25 mg every 8 hours as needed ordered.  Lotrimin 1% cream apply to affected area 2  times daily.  Take prednisone 10 mg as ordered      Relevant Medications   hydrOXYzine (VISTARIL) 25 MG capsule   Other Relevant Orders   Ambulatory referral to Dermatology   Right foot pain     - predniSONE (STERAPRED UNI-PAK 21 TAB) 10 MG (21) TBPK tablet; Take by mouth daily. Take 6 tabs by mouth daily  for 2 days, then 5 tabs for 2 days, then 4 tabs for 2 days, then 3 tabs for 2 days, 2 tabs for 2 days, then 1 tab by mouth daily for 2 days  Dispense: 42 tablet; Refill: 0 Lasix 20 mg daily as needed ordered for edema Continue over-the-counter Tylenol or ibuprofen as needed for pain Patient referred to podiatry . Walker provided  the patient today to assist with ambulation -       Relevant Orders   Ambulatory referral to Podiatry   Bilateral leg edema    Lasix 20 mg daily as needed ordered take with potassium 20 mEq. Keep legs elevated when sitting to help decrease swelling.       Relevant Medications   furosemide (LASIX) 20 MG tablet   potassium chloride SA (KLOR-CON M) 20 MEQ tablet   Encounter for examination following treatment at hospital   Current mild episode of major depressive disorder (HCC)       11/26/2022    1:16 PM  Depression screen PHQ 2/9  Decreased Interest 0  Down, Depressed, Hopeless 2  PHQ - 2 Score 2  Altered sleeping 3  Tired, decreased energy 2  Change in appetite 0  Feeling bad or failure about yourself  1  Trouble concentrating 0  Moving slowly or fidgety/restless 3  Suicidal thoughts 0  PHQ-9 Score 11  Difficult doing  work/chores Somewhat difficult   He denies SI, HI  Will refer to LCSW for counseling and to assist with application for health insurance       Relevant Medications   hydrOXYzine (VISTARIL) 25 MG capsule   Other Visit Diagnoses     Screening for colon cancer       Relevant Orders   Cologuard   Screening for diabetes mellitus       Relevant Orders   POCT glycosylated hemoglobin (Hb A1C) (Completed)       Outpatient Encounter Medications as of 11/26/2022  Medication Sig   furosemide (LASIX) 20 MG tablet Take 1 tablet (20 mg total) by mouth daily as needed. Take with potasium chloride   hydrOXYzine (VISTARIL) 25 MG capsule Take 1 capsule (25 mg total) by mouth every 8 (eight) hours as needed.   potassium chloride SA (KLOR-CON M) 20 MEQ tablet Take 1 tablet (20 mEq total) by mouth daily as needed. Take whenever you take lasix 20mg  daily as needed   triamcinolone cream (KENALOG) 0.1 % Apply 1 Application topically 2 (two) times daily.   cyclobenzaprine (FLEXERIL) 10 MG tablet Take 1 tablet (10 mg total) by mouth 2 (two) times daily as needed for muscle spasms.   ibuprofen (ADVIL,MOTRIN) 800 MG tablet Take 1 tablet (800 mg total) by mouth 3 (three) times daily.   predniSONE (STERAPRED UNI-PAK 21 TAB) 10 MG (21) TBPK tablet Take by mouth daily. Take 6 tabs by mouth daily  for 2 days, then 5 tabs for 2 days, then 4 tabs for 2 days, then 3 tabs for 2 days, 2 tabs for 2 days, then 1 tab by mouth daily for 2 days   traMADol (ULTRAM) 50 MG  tablet Take 1 tablet (50 mg total) by mouth every 6 (six) hours as needed.   [DISCONTINUED] clotrimazole (LOTRIMIN) 1 % cream Apply to affected area 2 times daily   [DISCONTINUED] clotrimazole (LOTRIMIN) 1 % cream Apply to affected area 2 times daily   [DISCONTINUED] predniSONE (STERAPRED UNI-PAK 21 TAB) 10 MG (21) TBPK tablet Take by mouth daily. Take 6 tabs by mouth daily  for 2 days, then 5 tabs for 2 days, then 4 tabs for 2 days, then 3 tabs for 2  days, 2 tabs for 2 days, then 1 tab by mouth daily for 2 days   No facility-administered encounter medications on file as of 11/26/2022.    Follow-up: Return in about 1 month (around 12/27/2022) for rashes .   Donell Beers, FNP

## 2022-11-26 NOTE — Progress Notes (Signed)
See note above

## 2022-11-26 NOTE — Assessment & Plan Note (Addendum)
Seems to be a chronic condition. Was encouraged to avoid scratching the site to prevent infection  kenalog 1 % cream; Apply to affected area 2 times daily - predniSONE (STERAPRED UNI-PAK 21 TAB) 10 MG (21) TBPK tablet; Take by mouth daily. Take 6 tabs by mouth daily  for 2 days, then 5 tabs for 2 days, then 4 tabs for 2 days, then 3 tabs for 2 days, 2 tabs for 2 days, then 1 tab by mouth daily for 2 days  Dispense: 42 tablet; Refill: 0  - hydrOXYzine (VISTARIL) 25 MG capsule; Take 1 capsule (25 mg total) by mouth every 8 (eight) hours as needed for itching.  Dispense: 30 capsule; Refill: 0 - Ambulatory referral to Dermatology

## 2022-11-26 NOTE — Patient Instructions (Signed)
   Rash and other nonspecific skin eruption  - clotrimazole (LOTRIMIN) 1 % cream; Apply to affected area 2 times daily  Dispense: 15 g; Refill: 0 - predniSONE (STERAPRED UNI-PAK 21 TAB) 10 MG (21) TBPK tablet; Take by mouth daily. Take 6 tabs by mouth daily  for 2 days, then 5 tabs for 2 days, then 4 tabs for 2 days, then 3 tabs for 2 days, 2 tabs for 2 days, then 1 tab by mouth daily for 2 days  Dispense: 42 tablet; Refill: 0 - Ambulatory referral to Dermatology   Itching  - hydrOXYzine (VISTARIL) 25 MG capsule; Take 1 capsule (25 mg total) by mouth every 8 (eight) hours as needed.  Dispense: 30 capsule; Refill: 0 - Ambulatory referral to Dermatology   Bilateral foot pain  - Ambulatory referral to Podiatry  . Bilateral leg edema  - furosemide (LASIX) 20 MG tablet; Take 1 tablet (20 mg total) by mouth daily as needed. Take with potasium chloride  Dispense: 10 tablet; Refill: 0 - potassium chloride SA (KLOR-CON M) 20 MEQ tablet; Take 1 tablet (20 mEq total) by mouth daily as needed. Take whenever you take lasix 20mg  daily as needed  Dispense: 20 tablet; Refill: 0

## 2022-11-26 NOTE — Assessment & Plan Note (Signed)
Smokes about 0.5 pack/day  Asked about quitting: confirms that he/she currently smokes cigarettes Advise to quit smoking: Educated about QUITTING to reduce the risk of cancer, cardio and cerebrovascular disease. Assess willingness: Unwilling to quit at this time, but is working on cutting back. Assist with counseling and pharmacotherapy: Counseled for 5 minutes and literature provided. Arrange for follow up: follow up in 1 months and continue to offer help.  

## 2022-12-10 ENCOUNTER — Telehealth: Payer: Self-pay | Admitting: Clinical

## 2022-12-10 NOTE — Telephone Encounter (Signed)
CSW called patient 11/27/22 and 11/28/22 to follow up on referral from PCP for mental health counseling and lack of health coverage. Patient did not answer at these attempts or today, 12/10/22. No voice mail was available. CSW is available from clinic as needed if patient follows up.  [facility] [phone]

## 2022-12-12 ENCOUNTER — Emergency Department (HOSPITAL_COMMUNITY): Payer: Medicaid Other

## 2022-12-12 ENCOUNTER — Encounter (HOSPITAL_COMMUNITY): Payer: Self-pay | Admitting: *Deleted

## 2022-12-12 ENCOUNTER — Inpatient Hospital Stay (HOSPITAL_COMMUNITY)
Admission: EM | Admit: 2022-12-12 | Discharge: 2022-12-16 | DRG: 603 | Disposition: A | Discharge status: Home / Self Care | Payer: Medicaid Other | Attending: Internal Medicine | Admitting: Internal Medicine

## 2022-12-12 ENCOUNTER — Other Ambulatory Visit: Payer: Self-pay

## 2022-12-12 DIAGNOSIS — Z91018 Allergy to other foods: Secondary | ICD-10-CM

## 2022-12-12 DIAGNOSIS — Z79899 Other long term (current) drug therapy: Secondary | ICD-10-CM

## 2022-12-12 DIAGNOSIS — L299 Pruritus, unspecified: Secondary | ICD-10-CM

## 2022-12-12 DIAGNOSIS — L0390 Cellulitis, unspecified: Secondary | ICD-10-CM | POA: Diagnosis present

## 2022-12-12 DIAGNOSIS — L309 Dermatitis, unspecified: Secondary | ICD-10-CM | POA: Diagnosis present

## 2022-12-12 DIAGNOSIS — I10 Essential (primary) hypertension: Secondary | ICD-10-CM | POA: Diagnosis present

## 2022-12-12 DIAGNOSIS — R600 Localized edema: Secondary | ICD-10-CM

## 2022-12-12 DIAGNOSIS — Z87891 Personal history of nicotine dependence: Secondary | ICD-10-CM

## 2022-12-12 DIAGNOSIS — L03115 Cellulitis of right lower limb: Principal | ICD-10-CM | POA: Diagnosis present

## 2022-12-12 DIAGNOSIS — Z8249 Family history of ischemic heart disease and other diseases of the circulatory system: Secondary | ICD-10-CM

## 2022-12-12 LAB — CBC WITH DIFFERENTIAL/PLATELET
Abs Immature Granulocytes: 0.11 10*3/uL — ABNORMAL HIGH (ref 0.00–0.07)
Basophils Absolute: 0 10*3/uL (ref 0.0–0.1)
Basophils Relative: 0 %
Eosinophils Absolute: 0.4 10*3/uL (ref 0.0–0.5)
Eosinophils Relative: 4 %
HCT: 40.2 % (ref 39.0–52.0)
Hemoglobin: 12.8 g/dL — ABNORMAL LOW (ref 13.0–17.0)
Immature Granulocytes: 1 %
Lymphocytes Relative: 8 %
Lymphs Abs: 0.9 10*3/uL (ref 0.7–4.0)
MCH: 19.1 pg — ABNORMAL LOW (ref 26.0–34.0)
MCHC: 31.8 g/dL (ref 30.0–36.0)
MCV: 60 fL — ABNORMAL LOW (ref 80.0–100.0)
Monocytes Absolute: 1.1 10*3/uL — ABNORMAL HIGH (ref 0.1–1.0)
Monocytes Relative: 10 %
Neutro Abs: 8.3 10*3/uL — ABNORMAL HIGH (ref 1.7–7.7)
Neutrophils Relative %: 77 %
Platelets: 180 10*3/uL (ref 150–400)
RBC: 6.7 MIL/uL — ABNORMAL HIGH (ref 4.22–5.81)
RDW: 19.7 % — ABNORMAL HIGH (ref 11.5–15.5)
WBC: 10.8 10*3/uL — ABNORMAL HIGH (ref 4.0–10.5)
nRBC: 0 % (ref 0.0–0.2)

## 2022-12-12 LAB — BASIC METABOLIC PANEL
Anion gap: 6 (ref 5–15)
BUN: 13 mg/dL (ref 6–20)
CO2: 25 mmol/L (ref 22–32)
Calcium: 9 mg/dL (ref 8.9–10.3)
Chloride: 107 mmol/L (ref 98–111)
Creatinine, Ser: 1.09 mg/dL (ref 0.61–1.24)
GFR, Estimated: >60 mL/min (ref >60)
Glucose, Bld: 68 mg/dL — ABNORMAL LOW (ref 70–99)
Potassium: 4.2 mmol/L (ref 3.5–5.1)
Sodium: 138 mmol/L (ref 135–145)

## 2022-12-12 NOTE — ED Provider Triage Note (Signed)
Emergency Medicine Provider Triage Evaluation Note  Timothy Mcdowell , a 52 y.o. male  was evaluated in triage.  Pt complains of right foot pain.  Patient with history of chronic right foot pain which has been more swollen and warm recently.  Patient given Lotrimin as well as prednisone from prior primary care appointment.  He is requesting crutches to help aid in ambulation.  Denies any recent falls/traumas, fever, chills, night sweats.  Review of Systems  Positive: See above Negative:   Physical Exam  BP (!) 156/100 (BP Location: Right Arm)   Pulse 78   Temp 98.1 F (36.7 C) (Oral)   Resp 18   Ht 5\' 10"  (1.778 m)   Wt 70.3 kg   SpO2 98%   BMI 22.24 kg/m  Gen:   Awake, no distress   Resp:  Normal effort  MSK:   Moves extremities without difficulty  Other:  Bilateral lower extremity edema with right greater than left of ankle and foot.  Right foot and ankle warm to the touch and more erythematic in appearance than left.  Medical Decision Making  Medically screening exam initiated at 6:14 PM.  Appropriate orders placed.  Timothy Mcdowell was informed that the remainder of the evaluation will be completed by another provider, this initial triage assessment does not replace that evaluation, and the importance of remaining in the ED until their evaluation is complete.     Wilnette Kales, [place] 12/12/22 1816

## 2022-12-12 NOTE — ED Triage Notes (Signed)
The rt foot has been painful for weeks  he has seen someone but he does not know who

## 2022-12-13 ENCOUNTER — Other Ambulatory Visit: Payer: Self-pay

## 2022-12-13 ENCOUNTER — Inpatient Hospital Stay (HOSPITAL_COMMUNITY): Payer: Medicaid Other

## 2022-12-13 DIAGNOSIS — L309 Dermatitis, unspecified: Secondary | ICD-10-CM | POA: Diagnosis present

## 2022-12-13 DIAGNOSIS — Z8249 Family history of ischemic heart disease and other diseases of the circulatory system: Secondary | ICD-10-CM | POA: Diagnosis not present

## 2022-12-13 DIAGNOSIS — Z87891 Personal history of nicotine dependence: Secondary | ICD-10-CM | POA: Diagnosis not present

## 2022-12-13 DIAGNOSIS — L0390 Cellulitis, unspecified: Secondary | ICD-10-CM | POA: Diagnosis present

## 2022-12-13 DIAGNOSIS — I10 Essential (primary) hypertension: Secondary | ICD-10-CM | POA: Diagnosis present

## 2022-12-13 DIAGNOSIS — Z91018 Allergy to other foods: Secondary | ICD-10-CM | POA: Diagnosis not present

## 2022-12-13 DIAGNOSIS — L03115 Cellulitis of right lower limb: Secondary | ICD-10-CM | POA: Diagnosis present

## 2022-12-13 DIAGNOSIS — Z79899 Other long term (current) drug therapy: Secondary | ICD-10-CM | POA: Diagnosis not present

## 2022-12-13 LAB — CBC WITH DIFFERENTIAL/PLATELET
Abs Immature Granulocytes: 0.08 10*3/uL — ABNORMAL HIGH (ref 0.00–0.07)
Basophils Absolute: 0 10*3/uL (ref 0.0–0.1)
Basophils Relative: 0 %
Eosinophils Absolute: 0.4 10*3/uL (ref 0.0–0.5)
Eosinophils Relative: 3 %
HCT: 37.4 % — ABNORMAL LOW (ref 39.0–52.0)
Hemoglobin: 12.4 g/dL — ABNORMAL LOW (ref 13.0–17.0)
Immature Granulocytes: 1 %
Lymphocytes Relative: 8 %
Lymphs Abs: 1 10*3/uL (ref 0.7–4.0)
MCH: 19.7 pg — ABNORMAL LOW (ref 26.0–34.0)
MCHC: 33.2 g/dL (ref 30.0–36.0)
MCV: 59.6 fL — ABNORMAL LOW (ref 80.0–100.0)
Monocytes Absolute: 1.1 10*3/uL — ABNORMAL HIGH (ref 0.1–1.0)
Monocytes Relative: 9 %
Neutro Abs: 9.1 10*3/uL — ABNORMAL HIGH (ref 1.7–7.7)
Neutrophils Relative %: 79 %
Platelets: 159 10*3/uL (ref 150–400)
RBC: 6.28 MIL/uL — ABNORMAL HIGH (ref 4.22–5.81)
RDW: 19.1 % — ABNORMAL HIGH (ref 11.5–15.5)
WBC: 11.6 10*3/uL — ABNORMAL HIGH (ref 4.0–10.5)
nRBC: 0 % (ref 0.0–0.2)

## 2022-12-13 LAB — COMPREHENSIVE METABOLIC PANEL
ALT: 44 U/L (ref 0–44)
AST: 43 U/L — ABNORMAL HIGH (ref 15–41)
Albumin: 3.2 g/dL — ABNORMAL LOW (ref 3.5–5.0)
Alkaline Phosphatase: 61 U/L (ref 38–126)
Anion gap: 8 (ref 5–15)
BUN: 10 mg/dL (ref 6–20)
CO2: 25 mmol/L (ref 22–32)
Calcium: 8.7 mg/dL — ABNORMAL LOW (ref 8.9–10.3)
Chloride: 107 mmol/L (ref 98–111)
Creatinine, Ser: 1.1 mg/dL (ref 0.61–1.24)
GFR, Estimated: >60 mL/min (ref >60)
Glucose, Bld: 101 mg/dL — ABNORMAL HIGH (ref 70–99)
Potassium: 4.1 mmol/L (ref 3.5–5.1)
Sodium: 140 mmol/L (ref 135–145)
Total Bilirubin: 1.2 mg/dL (ref 0.3–1.2)
Total Protein: 5.9 g/dL — ABNORMAL LOW (ref 6.5–8.1)

## 2022-12-13 LAB — PROCALCITONIN: Procalcitonin: <0.1 ng/mL

## 2022-12-13 LAB — FERRITIN: Ferritin: 208 ng/mL (ref 24–336)

## 2022-12-13 LAB — CBG MONITORING, ED
Glucose-Capillary: 117 mg/dL — ABNORMAL HIGH (ref 70–99)
Glucose-Capillary: 114 mg/dL — ABNORMAL HIGH (ref 70–99)

## 2022-12-13 LAB — C-REACTIVE PROTEIN: CRP: 1.5 mg/dL — ABNORMAL HIGH (ref <1.0)

## 2022-12-13 LAB — GLUCOSE, CAPILLARY: Glucose-Capillary: 116 mg/dL — ABNORMAL HIGH (ref 70–99)

## 2022-12-13 LAB — MAGNESIUM: Magnesium: 2 mg/dL (ref 1.7–2.4)

## 2022-12-13 MED ORDER — FENTANYL CITRATE PF 50 MCG/ML IJ SOSY
12.5000 ug | PREFILLED_SYRINGE | INTRAMUSCULAR | Status: DC | PRN
  Administered 2022-12-13: 50 ug via INTRAVENOUS
  Filled 2022-12-13: qty 1

## 2022-12-13 MED ORDER — VANCOMYCIN HCL IN DEXTROSE 1-5 GM/200ML-% IV SOLN
1000.0000 mg | Freq: Once | INTRAVENOUS | Status: AC
  Administered 2022-12-13: 1000 mg via INTRAVENOUS
  Filled 2022-12-13: qty 200

## 2022-12-13 MED ORDER — HYDROCODONE-ACETAMINOPHEN 5-325 MG PO TABS
1.0000 | ORAL_TABLET | ORAL | Status: DC | PRN
  Administered 2022-12-13: 2 via ORAL
  Administered 2022-12-13 (×2): 1 via ORAL
  Administered 2022-12-14 – 2022-12-16 (×11): 2 via ORAL
  Filled 2022-12-13: qty 2
  Filled 2022-12-13: qty 1
  Filled 2022-12-13 (×2): qty 2
  Filled 2022-12-13: qty 1
  Filled 2022-12-13 (×9): qty 2

## 2022-12-13 MED ORDER — HYDROCODONE-ACETAMINOPHEN 5-325 MG PO TABS
1.0000 | ORAL_TABLET | Freq: Once | ORAL | Status: AC
  Administered 2022-12-13: 1 via ORAL
  Filled 2022-12-13: qty 1

## 2022-12-13 MED ORDER — VANCOMYCIN HCL 750 MG/150ML IV SOLN
750.0000 mg | Freq: Two times a day (BID) | INTRAVENOUS | Status: DC
  Administered 2022-12-13 – 2022-12-16 (×7): 750 mg via INTRAVENOUS
  Filled 2022-12-13 (×8): qty 150

## 2022-12-13 MED ORDER — ACETAMINOPHEN 325 MG PO TABS: 650.0000 mg | ORAL_TABLET | Freq: Four times a day (QID) | ORAL | Status: DC | PRN

## 2022-12-13 MED ORDER — ALBUTEROL SULFATE (2.5 MG/3ML) 0.083% IN NEBU: 2.5000 mg | INHALATION_SOLUTION | RESPIRATORY_TRACT | Status: DC | PRN

## 2022-12-13 MED ORDER — GADOBUTROL 1 MMOL/ML IV SOLN
7.0000 mL | Freq: Once | INTRAVENOUS | Status: AC | PRN
  Administered 2022-12-13: 7 mL via INTRAVENOUS

## 2022-12-13 MED ORDER — AMLODIPINE BESYLATE 5 MG PO TABS
5.0000 mg | ORAL_TABLET | Freq: Every day | ORAL | Status: DC
  Administered 2022-12-13 – 2022-12-16 (×4): 5 mg via ORAL
  Filled 2022-12-13 (×4): qty 1

## 2022-12-13 MED ORDER — HEPARIN SODIUM (PORCINE) 5000 UNIT/ML IJ SOLN
5000.0000 [IU] | Freq: Three times a day (TID) | INTRAMUSCULAR | Status: DC
  Administered 2022-12-13 – 2022-12-16 (×9): 5000 [IU] via SUBCUTANEOUS
  Filled 2022-12-13 (×9): qty 1

## 2022-12-13 MED ORDER — ACETAMINOPHEN 650 MG RE SUPP: 650.0000 mg | Freq: Four times a day (QID) | RECTAL | Status: DC | PRN

## 2022-12-13 MED ORDER — SODIUM CHLORIDE 0.9 % IV SOLN
2.0000 g | Freq: Once | INTRAVENOUS | Status: AC
  Administered 2022-12-13: 2 g via INTRAVENOUS
  Filled 2022-12-13: qty 12.5

## 2022-12-13 MED ORDER — LISINOPRIL 20 MG PO TABS
20.0000 mg | ORAL_TABLET | Freq: Every day | ORAL | Status: DC
  Administered 2022-12-13 – 2022-12-16 (×4): 20 mg via ORAL
  Filled 2022-12-13 (×4): qty 1

## 2022-12-13 MED ORDER — SODIUM CHLORIDE 0.9 % IV SOLN
2.0000 g | Freq: Three times a day (TID) | INTRAVENOUS | Status: DC
  Administered 2022-12-13 – 2022-12-16 (×10): 2 g via INTRAVENOUS
  Filled 2022-12-13 (×10): qty 12.5

## 2022-12-13 NOTE — ED Notes (Signed)
Transfer of care report given to inpatient RN, Nori Riis.

## 2022-12-13 NOTE — ED Notes (Signed)
Pt to MRI

## 2022-12-13 NOTE — ED Notes (Signed)
Dr Christy Gentles notified of patient's hypertension via secure chat

## 2022-12-13 NOTE — ED Notes (Signed)
Pt found sitting at the edge of the bed, crying and visibly uncomfortable. Rates pain 20/10 in the RLE. Provider made aware and PRN order for Norco provided and given to the pt.

## 2022-12-13 NOTE — ED Provider Notes (Signed)
[facility] EMERGENCY DEPARTMENT Provider Note   CSN: [id number] Arrival date & time: 12/12/22  1725     History  Chief Complaint  Patient presents with   Foot Pain    Timothy Mcdowell is a 52 y.o. male.  The history is provided by the patient.  Foot Pain  Patient w/history of hypertension presents with right foot pain.  He reports it has been ongoing for several weeks.  He was seen earlier in December and was given antibiotics for possible infection.  He had stepped on a nail at that time.  He has since followed up as an outpatient and was given creams for his feet.  He reports taking medications without any improvement.  He denies increasing pain, swelling and  redness to the right foot.  No new trauma.    Past Medical History:  Diagnosis Date   Hypertension     Home Medications Prior to Admission medications   Medication Sig Start Date End Date Taking? Authorizing Provider  furosemide (LASIX) 20 MG tablet Take 1 tablet (20 mg total) by mouth daily as needed. Take with potasium chloride 11/26/22  Yes Paseda, Baird Kay, FNP  potassium chloride SA (KLOR-CON M) 20 MEQ tablet Take 1 tablet (20 mEq total) by mouth daily as needed. Take whenever you take lasix 20mg  daily as needed 11/26/22  Yes Paseda, 11/28/22, FNP  clotrimazole (LOTRIMIN) 1 % cream Apply 1 Application topically 2 (two) times daily. Patient not taking: Reported on 12/13/2022 11/26/22   [provider]  hydrOXYzine (VISTARIL) 25 MG capsule Take 1 capsule (25 mg total) by mouth every 8 (eight) hours as needed. Patient not taking: Reported on 12/13/2022 11/26/22   11/28/22, FNP  predniSONE (STERAPRED UNI-PAK 21 TAB) 10 MG (21) TBPK tablet Take by mouth daily. Take 6 tabs by mouth daily  for 2 days, then 5 tabs for 2 days, then 4 tabs for 2 days, then 3 tabs for 2 days, 2 tabs for 2 days, then 1 tab by mouth daily for 2 days Patient not taking: Reported on 12/13/2022 11/26/22    11/28/22, FNP  triamcinolone cream (KENALOG) 0.1 % Apply 1 Application topically 2 (two) times daily. Patient not taking: Reported on 12/13/2022 11/26/22   11/28/22, FNP      Allergies    Other    Review of Systems   Review of Systems  Musculoskeletal:  Positive for arthralgias.    Physical Exam Updated Vital Signs BP 130/68   Pulse 82   Temp 98.4 F (36.9 C) (Oral)   Resp 18   Ht 1.778 m (5\' 10" )   Wt 70.3 kg   SpO2 100%   BMI 22.24 kg/m  Physical Exam CONSTITUTIONAL: Disheveled and anxious HEAD: Normocephalic/atraumatic ENMT: Mucous membranes moist, poor dentition NECK: supple no meningeal signs CV: S1/S2 noted, no murmurs/rubs/gallops noted LUNGS: Lungs are clear to auscultation bilaterally, no apparent distress NEURO: Pt is awake/alert/appropriate, moves all extremitiesx4.  No facial droop.   EXTREMITIES: pulses normal/equal, full ROM Distal pulses equal and intact.  No crepitus to the right foot.  erythema and tenderness throughout the right foot.  No puncture wounds noted to the plantar surface.  No wounds noted to the webspaces. SKIN: warm, see photo below PSYCH: Anxious       ED Results / Procedures / Treatments   Labs (all labs ordered are listed, but only abnormal results are displayed) Labs Reviewed  BASIC METABOLIC PANEL -  Abnormal; Notable for the following components:      Result Value   Glucose, Bld 68 (*)    All other components within normal limits  CBC WITH DIFFERENTIAL/PLATELET - Abnormal; Notable for the following components:   WBC 10.8 (*)    RBC 6.70 (*)    Hemoglobin 12.8 (*)    MCV 60.0 (*)    MCH 19.1 (*)    RDW 19.7 (*)    Neutro Abs 8.3 (*)    Monocytes Absolute 1.1 (*)    Abs Immature Granulocytes 0.11 (*)    All other components within normal limits    EKG None  Radiology DG Foot Complete Right  Result Date: 12/12/2022 CLINICAL DATA:  Pain EXAM: RIGHT FOOT COMPLETE - 3+ VIEW COMPARISON:  None  Available. FINDINGS: No recent displaced fracture or dislocation is seen. No focal lytic lesions are seen. Small bony spurs seen in first metatarsophalangeal joint. There is 3 mm smooth marginated calcification in the dorsal aspect of navicular, possibly old avulsion or ligament calcification from previous injury. There is marked soft tissue swelling over the dorsum. No opaque foreign bodies are seen. IMPRESSION: No recent fracture or dislocation is seen. No focal lytic lesions are seen. There is 3 mm smooth marginated calcification adjacent to the dorsal margin of navicular, possibly residual change from previous injury. There is marked soft tissue swelling over the dorsum. If there is clinical suspicion for osteomyelitis, follow-up MRI may be considered. Electronically Signed   By: Elmer Picker M.D.   On: 12/12/2022 18:57    Procedures Procedures    Medications Ordered in ED Medications  vancomycin (VANCOCIN) IVPB 1000 mg/200 mL premix (has no administration in time range)  HYDROcodone-acetaminophen (NORCO/VICODIN) 5-325 MG per tablet 1 tablet (1 tablet Oral Given 12/13/22 0047)  ceFEPIme (MAXIPIME) 2 g in sodium chloride 0.9 % 100 mL IVPB (2 g Intravenous New Bag/Given 12/13/22 0047)    ED Course/ Medical Decision Making/ A&P Clinical Course as of 12/13/22 0123  Sat Dec 13, 2022  0116 WBC(!): 10.8 Leukocytosis [DW]  0121 Patient reports ongoing foot pain for several weeks.  He was seen earlier in December after stepping on a nail and was given a dose of Cipro and Keflex.  Also follow-up as an outpatient.  He may also have underlying psoriasis.  He appears to have likely superimposed cellulitis [DW]  0123 Discussed with Dr. Marcello Moores for admission [DW]    Clinical Course User Index [DW] Ripley Fraise, MD                           Medical Decision Making Amount and/or Complexity of Data Reviewed Labs:  Decision-making details documented in ED Course.  Risk Prescription drug  management. Decision regarding hospitalization.   This patient presents to the ED for concern of right foot pain, this involves an extensive number of treatment options, and is a complaint that carries with it a high risk of complications and morbidity.  The differential diagnosis includes but is not limited to cellulitis, abscess, necrotizing fasciitis, fracture  Comorbidities that complicate the patient evaluation: Patient's presentation is complicated by their history of hypertension  Social Determinants of Health: Patient's  tobacco use   increases the complexity of managing their presentation Patient is also self-pay  Additional history obtained: Records reviewed Primary Care Documents  Lab Tests: I Ordered, and personally interpreted labs.  The pertinent results include: Leukocytosis  Imaging Studies ordered: I ordered imaging studies  including X-ray right foot   I independently visualized and interpreted imaging which showed tissue swelling, no fracture I agree with the radiologist interpretation  Medicines ordered and prescription drug management: I ordered medication including IV antibiotics for cellulitis Reevaluation of the patient after these medicines showed that the patient    stayed the same   Critical Interventions:   IV antibiotics  Consultations Obtained: I requested consultation with the admitting physician Dr. Maisie Fus , and discussed  findings as well as pertinent plan - they recommend: Admit  Reevaluation: After the interventions noted above, I reevaluated the patient and found that they have :stayed the same  Complexity of problems addressed: Patient's presentation is most consistent with  acute presentation with potential threat to life or bodily function  Disposition: After consideration of the diagnostic results and the patient's response to treatment,  I feel that the patent would benefit from admission   .           Final Clinical  Impression(s) / ED Diagnoses Final diagnoses:  Cellulitis of right foot    Rx / DC Orders ED Discharge Orders     None         Zadie Rhine, MD 12/13/22 [phone]

## 2022-12-13 NOTE — ED Notes (Signed)
Dr Christy Gentles acknowledges message regarding hypertension at this time

## 2022-12-13 NOTE — ED Notes (Signed)
Pt reporting pt, PRN given.

## 2022-12-13 NOTE — Progress Notes (Signed)
PROGRESS NOTE    Timothy Mcdowell  BMW:[id number] DOB: 1971-05-19 DOA: 12/12/2022 PCP: Donell Beers, FNP  Outpatient Specialists:     Brief Narrative:  Patient is a 52 year old male with past medical history significant for hypertension.  Patient has been admitted with complicated soft tissue infection involving the right foot.  Apparently, patient stepped on a nail about 2 to 3 weeks prior to presentation.  Subsequently, patient developed swelling and pain of the ankle as well as skin changes dorsum of both feet.  Patient was seen by dermatology and was prescribed antifungal and steroid cream.  Despite the cream, patient's condition continued to deteriorate.  Patient is currently on IV vancomycin and cefepime.  Swelling and pain of the right lower extremity is improving.  No fever or chills.  Blood pressure is noted to be elevated.   Assessment & Plan:   Principal Problem:   Cellulitis   Cellulitis/soft tissue infection of the right foot: -Continue vancomycin and cefepime. -MRI of the right foot revealed: 1. Extensive dorsal forefoot subcutaneous edema and ill-defined fluid consistent with cellulitis. 2. There are questionable small skin wounds along the dorsal aspect of the 1st web space and along the dorsal aspect of the 2nd proximal toe with adjacent tiny abscesses. No drainable fluid collections are identified. 3. No evidence of septic arthritis or osteomyelitis. 4. Mild degenerative changes in the midfoot and the tibial sesamoid of the 1st metatarsal.   Hypertension: -Uncontrolled. -Start Norvasc 5 Mg p.o. once daily. -Add lisinopril 20 Mg p.o. once daily. -Goal blood pressure should be less than 130/80 mmHg.  DVT prophylaxis: Subcutaneous heparin Code Status: Full code Family Communication:  Disposition Plan: Home eventually   Consultants:  None  Procedures:  None  Antimicrobials:  IV vancomycin IV cefepime   Subjective: Swelling of right foot is  slowly improving.  Objective: Vitals:   12/13/22 1127 12/13/22 1425 12/13/22 1509 12/13/22 1512  BP: (!) 166/105 (!) 173/99  (!) 163/98  Pulse: 83 68 80   Resp: 20 16 20    Temp: 98.5 F (36.9 C)  98 F (36.7 C)   TempSrc: Oral  Oral   SpO2: 98% 100% 96%   Weight:      Height:        Intake/Output Summary (Last 24 hours) at 12/13/2022 1554 Last data filed at 12/13/2022 1325 Gross per 24 hour  Intake 250 ml  Output --  Net 250 ml   Filed Weights   12/12/22 1742  Weight: 70.3 kg    Examination:  General exam: Appears calm and comfortable  Respiratory system: Clear to auscultation.  Cardiovascular system: S1 & S2 heard. Gastrointestinal system: Abdomen is nondistended, soft and nontender. No organomegaly or masses felt. Normal bowel sounds heard. Central nervous system: Alert and oriented. No focal neurological deficits. Extremities: Swelling of the right foot.  Bilateral skin changes involving dorsum of both feet and the shin area, worse on the right side.   Data Reviewed: I have personally reviewed following labs and imaging studies  CBC: Recent Labs  Lab 12/12/22 1834 12/13/22 0816  WBC 10.8* 11.6*  NEUTROABS 8.3* 9.1*  HGB 12.8* 12.4*  HCT 40.2 37.4*  MCV 60.0* 59.6*  PLT 180 159   Basic Metabolic Panel: Recent Labs  Lab 12/12/22 1834 12/13/22 0816  NA 138 140  K 4.2 4.1  CL 107 107  CO2 25 25  GLUCOSE 68* 101*  BUN 13 10  CREATININE 1.09 1.10  CALCIUM 9.0 8.7*  MG  --  2.0   GFR: Estimated Creatinine Clearance: 78.1 mL/min (by C-G formula based on SCr of 1.1 mg/dL). Liver Function Tests: Recent Labs  Lab 12/13/22 0816  AST 43*  ALT 44  ALKPHOS 61  BILITOT 1.2  PROT 5.9*  ALBUMIN 3.2*   No results for input(s): "LIPASE", "AMYLASE" in the last 168 hours. No results for input(s): "AMMONIA" in the last 168 hours. Coagulation Profile: No results for input(s): "INR", "PROTIME" in the last 168 hours. Cardiac Enzymes: No results for input(s):  "CKTOTAL", "CKMB", "CKMBINDEX", "TROPONINI" in the last 168 hours. BNP (last 3 results) No results for input(s): "PROBNP" in the last 8760 hours. HbA1C: No results for input(s): "HGBA1C" in the last 72 hours. CBG: Recent Labs  Lab 12/13/22 0124 12/13/22 1123  GLUCAP 117* 114*   Lipid Profile: No results for input(s): "CHOL", "HDL", "LDLCALC", "TRIG", "CHOLHDL", "LDLDIRECT" in the last 72 hours. Thyroid Function Tests: No results for input(s): "TSH", "T4TOTAL", "FREET4", "T3FREE", "THYROIDAB" in the last 72 hours. Anemia Panel: Recent Labs    12/13/22 0816  FERRITIN 208   Urine analysis:    Component Value Date/Time   COLORURINE AMBER (A) 08/11/2016 1151   APPEARANCEUR CLEAR 08/11/2016 1151   LABSPEC 1.026 08/11/2016 1151   PHURINE 7.0 08/11/2016 1151   GLUCOSEU NEGATIVE 08/11/2016 1151   HGBUR NEGATIVE 08/11/2016 1151   BILIRUBINUR SMALL (A) 08/11/2016 1151   KETONESUR 15 (A) 08/11/2016 1151   PROTEINUR NEGATIVE 08/11/2016 1151   NITRITE NEGATIVE 08/11/2016 1151   LEUKOCYTESUR NEGATIVE 08/11/2016 1151   Sepsis Labs: @LABRCNTIP (procalcitonin:4,lacticidven:4)  )No results found for this or any previous visit (from the past 240 hour(s)).       Radiology Studies: MR FOOT RIGHT W WO CONTRAST  Result Date: 12/13/2022 CLINICAL DATA:  Acute foot pain with limping and swelling for 2 weeks. Infection suspected. EXAM: MRI OF THE RIGHT FOREFOOT WITHOUT AND WITH CONTRAST TECHNIQUE: Multiplanar, multisequence MR imaging of the right forefoot was performed before and after the administration of intravenous contrast. CONTRAST:  84mL GADAVIST GADOBUTROL 1 MMOL/ML IV SOLN COMPARISON:  Radiographs 12/12/2022 FINDINGS: Bones/Joint/Cartilage No evidence of acute fracture, dislocation, bone destruction or suspicious osseous enhancement. There are mild degenerative changes in the midfoot and the tibial sesamoid of the 1st metatarsal. No significant joint effusions or abnormal synovial  enhancement. Ligaments The Lisfranc ligament is intact. The collateral ligaments of the metatarsophalangeal joints are intact. Muscles and Tendons The forefoot muscles appear normal. No abnormal muscular enhancement or intramuscular fluid collection. Tendons appear intact without significant tenosynovitis. Soft tissues Extensive dorsal subcutaneous edema and ill-defined fluid without focal fluid collection or abnormal enhancement. There are questionable small skin wounds along the dorsal aspect of the 1st web space and along the dorsal aspect of the 2nd proximal toe (most obvious on coronal image 13/4). There are small T2 hyperintense fluid collections in the underlying fat which demonstrate mild peripheral enhancement following contrast. These measure 8 mm maximally. No drainable fluid collections are identified. Incidental pressure lesion plantar to the 1st metatarsophalangeal joint. IMPRESSION: 1. Extensive dorsal forefoot subcutaneous edema and ill-defined fluid consistent with cellulitis. 2. There are questionable small skin wounds along the dorsal aspect of the 1st web space and along the dorsal aspect of the 2nd proximal toe with adjacent tiny abscesses. No drainable fluid collections are identified. 3. No evidence of septic arthritis or osteomyelitis. 4. Mild degenerative changes in the midfoot and the tibial sesamoid of the 1st metatarsal. Electronically Signed   By: Caryl Comes.D.  On: 12/13/2022 14:59   DG Foot Complete Right  Result Date: 12/12/2022 CLINICAL DATA:  Pain EXAM: RIGHT FOOT COMPLETE - 3+ VIEW COMPARISON:  None Available. FINDINGS: No recent displaced fracture or dislocation is seen. No focal lytic lesions are seen. Small bony spurs seen in first metatarsophalangeal joint. There is 3 mm smooth marginated calcification in the dorsal aspect of navicular, possibly old avulsion or ligament calcification from previous injury. There is marked soft tissue swelling over the dorsum. No  opaque foreign bodies are seen. IMPRESSION: No recent fracture or dislocation is seen. No focal lytic lesions are seen. There is 3 mm smooth marginated calcification adjacent to the dorsal margin of navicular, possibly residual change from previous injury. There is marked soft tissue swelling over the dorsum. If there is clinical suspicion for osteomyelitis, follow-up MRI may be considered. Electronically Signed   By: Elmer Picker M.D.   On: 12/12/2022 18:57        Scheduled Meds:  heparin  5,000 Units Subcutaneous Q8H   Continuous Infusions:  ceFEPime (MAXIPIME) IV 2 g (12/13/22 1545)   vancomycin Stopped (12/13/22 1325)     LOS: 0 days    Time spent: 35 minutes    Dana Allan, MD  [facility] Pager #: [phone] 7PM-7AM contact night coverage as above

## 2022-12-13 NOTE — Progress Notes (Signed)
Pt admitted for cellulitis/osteomyelitis of lower right extremity. Pt has BLE swelling with the right being greater than the left. Pt has bilateral upper and lower extremity blisters on both forearms and upper legs. Cleaned with betadine and covered with foam bandage. Pt on tele. NSR, and started on IV antibiotics. Pt BP (!) 163/98   Pulse 80   Temp 98 F (36.7 C) (Oral)   Resp 20   Ht 5\' 10"  (1.778 m)   Wt 70.3 kg   SpO2 96%   BMI 22.24 kg/m  Call bell near by, bed in lowest position, 2/4 rails up. Pt is independent for ambulation. No further needs voiced at this time. Louanne Skye 12/13/22 4:07 PM

## 2022-12-13 NOTE — Progress Notes (Signed)
Pharmacy Antibiotic Note  Timothy Mcdowell is a 52 y.o. male admitted on 12/12/2022 with cellulitis.  Pharmacy has been consulted for vancomycin dosing.  Plan: Rec'd vanc 1g in ED. Vancomycin 750mg  IV Q12H. Goal AUC 400-550.  Expected AUC 425.  Height: 5\' 10"  (177.8 cm) Weight: 70.3 kg (154 lb 15.7 oz) IBW/kg (Calculated) : 73  Temp (24hrs), Avg:98.1 F (36.7 C), Min:97.5 F (36.4 C), Max:98.7 F (37.1 C)  Recent Labs  Lab 12/12/22 1834  WBC 10.8*  CREATININE 1.09    Estimated Creatinine Clearance: 78.8 mL/min (by C-G formula based on SCr of 1.09 mg/dL).    Allergies  Allergen Reactions   Other Rash    strawberries    Thank you for allowing pharmacy to be a part of this patient's care.  Wynona Neat, PharmD, BCPS  12/13/2022 6:02 AM

## 2022-12-13 NOTE — H&P (Signed)
History and Physical    Timothy Mcdowell [id number] DOB: 1971/03/11 DOA: 12/12/2022  PCP: Renee Rival, FNP  Patient coming from: home    I have personally briefly reviewed patient's old medical records in [place]  Chief Complaint: recurrent ,right foot infection / swelling /pain   HPI: Timothy Mcdowell is a 52 y.o. male with medical history significant of  Hypertension, who presents to ED with complaint of right swollen and pain full ankle and foot with skin changes ongoing for around 2-3 weeks. He states that he notes symptoms after he stepped on nail. Patient was seen complaint in ER on 12/13 and discharged on cipro with dermatology follow. Patient states he did  follow up with dermatology and was given antifungal and steroid cream. He states however , symptoms have progressed and pain has increase as well as swelling so he returns to ED. He also notes associated chills but no fever. He denies any trauma or new injury to foot.   ED Course:  Afeb, bp 156/100, hr 78 , rr 18 , sat 98%  Labs:  Wbc 10.8, hgb 12.8plt180, increase pmn left shit  Na:138, K 4.2 cl 107, glu68 DG:No recent fracture or dislocation is seen. No focal lytic lesions are seen.   There is 3 mm smooth marginated calcification adjacent to the dorsal margin of navicular, possibly residual change from previous injury.   There is marked soft tissue swelling over the dorsum. If there is clinical suspicion for osteomyelitis, follow-up MRI may be considered.  Tx cefepime, norco,vanc  Review of Systems: As per HPI otherwise 10 point review of systems negative.   Past Medical History:  Diagnosis Date   Hypertension     History reviewed. No pertinent surgical history.   reports that he has been smoking. He has never used smokeless tobacco. He reports current drug use. Drug: Marijuana. He reports that he does not drink alcohol.  Allergies  Allergen Reactions   Other Rash    strawberries    Family  History  Problem Relation Age of Onset   Hypertension Mother     Prior to Admission medications   Medication Sig Start Date End Date Taking? Authorizing Provider  furosemide (LASIX) 20 MG tablet Take 1 tablet (20 mg total) by mouth daily as needed. Take with potasium chloride 29meq 11/26/22  Yes Paseda, Dewaine Conger, FNP  potassium chloride SA (KLOR-CON M) 20 MEQ tablet Take 1 tablet (20 mEq total) by mouth daily as needed. Take whenever you take lasix 20mg  daily as needed 11/26/22  Yes Paseda, Dewaine Conger, FNP  clotrimazole (LOTRIMIN) 1 % cream Apply 1 Application topically 2 (two) times daily. Patient not taking: Reported on 12/13/2022 11/26/22   [provider]  hydrOXYzine (VISTARIL) 25 MG capsule Take 1 capsule (25 mg total) by mouth every 8 (eight) hours as needed. Patient not taking: Reported on 12/13/2022 11/26/22   Renee Rival, FNP  predniSONE (STERAPRED UNI-PAK 21 TAB) 10 MG (21) TBPK tablet Take by mouth daily. Take 6 tabs by mouth daily  for 2 days, then 5 tabs for 2 days, then 4 tabs for 2 days, then 3 tabs for 2 days, 2 tabs for 2 days, then 1 tab by mouth daily for 2 days Patient not taking: Reported on 12/13/2022 11/26/22   Renee Rival, FNP  triamcinolone cream (KENALOG) 0.1 % Apply 1 Application topically 2 (two) times daily. Patient not taking: Reported on 12/13/2022 11/26/22   Renee Rival, FNP  Physical Exam: Vitals:   12/13/22 0024 12/13/22 0030 12/13/22 0045 12/13/22 0100  BP:   (!) 170/104 130/68  Pulse:  90 83 82  Resp:   18   Temp: 98.4 F (36.9 C)     TempSrc: Oral     SpO2:  98% 97% 100%  Weight:      Height:        Constitutional: NAD, calm, comfortable Vitals:   12/13/22 0024 12/13/22 0030 12/13/22 0045 12/13/22 0100  BP:   (!) 170/104 130/68  Pulse:  90 83 82  Resp:   18   Temp: 98.4 F (36.9 C)     TempSrc: Oral     SpO2:  98% 97% 100%  Weight:      Height:       Eyes: PERRL, lids and conjunctivae normal ENMT:  Mucous membranes are moist. Posterior pharynx clear of any exudate or lesions.Normal dentition.  Neck: normal, supple, no masses, no thyromegaly Respiratory: clear to auscultation bilaterally, no wheezing, no crackles. Normal respiratory effort. No accessory muscle use.  Cardiovascular: Regular rate and rhythm, no murmurs / rubs / gallops. +extremity edema. 2+ pedal pulses. No carotid bruits.  Abdomen: no tenderness, no masses palpated. No hepatosplenomegaly. Bowel sounds positive.  Musculoskeletal: no clubbing / cyanosis. No joint deformity upper and lower extremities. Good ROM, no contractures. Normal muscle tone.  Skin: b/l  foot skin changes, right >L, right noted to be more swollen and tender, Neurologic: CN 2-12 grossly intact. Sensation intact, . Strength 5/5 in all 4.  Psychiatric: Normal judgment and insight. Alert and oriented x 3. Normal mood.    Labs on Admission: I have personally reviewed following labs and imaging studies  CBC: Recent Labs  Lab 12/12/22 1834  WBC 10.8*  NEUTROABS 8.3*  HGB 12.8*  HCT 40.2  MCV 60.0*  PLT 010   Basic Metabolic Panel: Recent Labs  Lab 12/12/22 1834  NA 138  K 4.2  CL 107  CO2 25  GLUCOSE 68*  BUN 13  CREATININE 1.09  CALCIUM 9.0   GFR: Estimated Creatinine Clearance: 78.8 mL/min (by C-G formula based on SCr of 1.09 mg/dL). Liver Function Tests: No results for input(s): "AST", "ALT", "ALKPHOS", "BILITOT", "PROT", "ALBUMIN" in the last 168 hours. No results for input(s): "LIPASE", "AMYLASE" in the last 168 hours. No results for input(s): "AMMONIA" in the last 168 hours. Coagulation Profile: No results for input(s): "INR", "PROTIME" in the last 168 hours. Cardiac Enzymes: No results for input(s): "CKTOTAL", "CKMB", "CKMBINDEX", "TROPONINI" in the last 168 hours. BNP (last 3 results) No results for input(s): "PROBNP" in the last 8760 hours. HbA1C: No results for input(s): "HGBA1C" in the last 72 hours. CBG: No results for  input(s): "GLUCAP" in the last 168 hours. Lipid Profile: No results for input(s): "CHOL", "HDL", "LDLCALC", "TRIG", "CHOLHDL", "LDLDIRECT" in the last 72 hours. Thyroid Function Tests: No results for input(s): "TSH", "T4TOTAL", "FREET4", "T3FREE", "THYROIDAB" in the last 72 hours. Anemia Panel: No results for input(s): "VITAMINB12", "FOLATE", "FERRITIN", "TIBC", "IRON", "RETICCTPCT" in the last 72 hours. Urine analysis:    Component Value Date/Time   COLORURINE AMBER (A) 08/11/2016 1151   APPEARANCEUR CLEAR 08/11/2016 1151   LABSPEC 1.026 08/11/2016 1151   PHURINE 7.0 08/11/2016 1151   GLUCOSEU NEGATIVE 08/11/2016 1151   HGBUR NEGATIVE 08/11/2016 1151   BILIRUBINUR SMALL (A) 08/11/2016 1151   KETONESUR 15 (A) 08/11/2016 1151   PROTEINUR NEGATIVE 08/11/2016 1151   NITRITE NEGATIVE 08/11/2016 1151   LEUKOCYTESUR NEGATIVE  08/11/2016 1151    Radiological Exams on Admission: DG Foot Complete Right  Result Date: 12/12/2022 CLINICAL DATA:  Pain EXAM: RIGHT FOOT COMPLETE - 3+ VIEW COMPARISON:  None Available. FINDINGS: No recent displaced fracture or dislocation is seen. No focal lytic lesions are seen. Small bony spurs seen in first metatarsophalangeal joint. There is 3 mm smooth marginated calcification in the dorsal aspect of navicular, possibly old avulsion or ligament calcification from previous injury. There is marked soft tissue swelling over the dorsum. No opaque foreign bodies are seen. IMPRESSION: No recent fracture or dislocation is seen. No focal lytic lesions are seen. There is 3 mm smooth marginated calcification adjacent to the dorsal margin of navicular, possibly residual change from previous injury. There is marked soft tissue swelling over the dorsum. If there is clinical suspicion for osteomyelitis, follow-up MRI may be considered. Electronically Signed   By: Ernie Avena M.D.   On: 12/12/2022 18:57    EKG: Independently reviewed.   Assessment/Plan  Complicated soft  tissue infection of Right foot , recurrent/persistent  -has had failure of out pt abx/ as well as antifungal and steroid cream  -xray notes soft tissue inflammation on right  -note increase wbc  -continue with vanc/cefepime  - MRI for further evaluation   HTN -elevated due to pain  -patient is not currently on anti-hypertensive medications  -monitor , treat pain  -consider maxzide   Abn Glucose -check a1c  DVT prophylaxis: heparin Code Status: full/ as discussed per patient wishes in event of cardiac arrest  Family Communication: none at bedside Disposition Plan: patient  expected to be admitted greater than 2 midnights  Consults called: n/a Admission status: med tele   Lurline Del MD [facility]   If 7PM-7AM, please contact night-coverage [email or web address] Password [email or web address]  12/13/2022, 1:25 AM

## 2022-12-14 LAB — CBC WITH DIFFERENTIAL/PLATELET
Abs Immature Granulocytes: 0.06 10*3/uL (ref 0.00–0.07)
Basophils Absolute: 0 10*3/uL (ref 0.0–0.1)
Basophils Relative: 0 %
Eosinophils Absolute: 0.3 10*3/uL (ref 0.0–0.5)
Eosinophils Relative: 3 %
HCT: 37.3 % — ABNORMAL LOW (ref 39.0–52.0)
Hemoglobin: 12.4 g/dL — ABNORMAL LOW (ref 13.0–17.0)
Immature Granulocytes: 1 %
Lymphocytes Relative: 9 %
Lymphs Abs: 0.9 10*3/uL (ref 0.7–4.0)
MCH: 19.7 pg — ABNORMAL LOW (ref 26.0–34.0)
MCHC: 33.2 g/dL (ref 30.0–36.0)
MCV: 59.4 fL — ABNORMAL LOW (ref 80.0–100.0)
Monocytes Absolute: 0.9 10*3/uL (ref 0.1–1.0)
Monocytes Relative: 9 %
Neutro Abs: 8.7 10*3/uL — ABNORMAL HIGH (ref 1.7–7.7)
Neutrophils Relative %: 78 %
Platelets: 150 10*3/uL (ref 150–400)
RBC: 6.28 MIL/uL — ABNORMAL HIGH (ref 4.22–5.81)
RDW: 19.1 % — ABNORMAL HIGH (ref 11.5–15.5)
WBC: 11 10*3/uL — ABNORMAL HIGH (ref 4.0–10.5)
nRBC: 0 % (ref 0.0–0.2)

## 2022-12-14 LAB — COMPREHENSIVE METABOLIC PANEL
ALT: 82 U/L — ABNORMAL HIGH (ref 0–44)
AST: 66 U/L — ABNORMAL HIGH (ref 15–41)
Albumin: 3.1 g/dL — ABNORMAL LOW (ref 3.5–5.0)
Alkaline Phosphatase: 61 U/L (ref 38–126)
Anion gap: 10 (ref 5–15)
BUN: 15 mg/dL (ref 6–20)
CO2: 23 mmol/L (ref 22–32)
Calcium: 8.6 mg/dL — ABNORMAL LOW (ref 8.9–10.3)
Chloride: 102 mmol/L (ref 98–111)
Creatinine, Ser: 1.02 mg/dL (ref 0.61–1.24)
GFR, Estimated: >60 mL/min (ref >60)
Glucose, Bld: 89 mg/dL (ref 70–99)
Potassium: 4.1 mmol/L (ref 3.5–5.1)
Sodium: 135 mmol/L (ref 135–145)
Total Bilirubin: 1.3 mg/dL — ABNORMAL HIGH (ref 0.3–1.2)
Total Protein: 6 g/dL — ABNORMAL LOW (ref 6.5–8.1)

## 2022-12-14 LAB — GLUCOSE, CAPILLARY
Glucose-Capillary: 223 mg/dL — ABNORMAL HIGH (ref 70–99)
Glucose-Capillary: 136 mg/dL — ABNORMAL HIGH (ref 70–99)
Glucose-Capillary: 135 mg/dL — ABNORMAL HIGH (ref 70–99)
Glucose-Capillary: 104 mg/dL — ABNORMAL HIGH (ref 70–99)

## 2022-12-14 NOTE — TOC Initial Note (Signed)
Transition of Care [facility]) - Initial/Assessment Note    Patient Details  Name: Timothy Mcdowell MRN: [id number] Date of Birth: 1971-07-02  Transition of Care [facility]) CM/SW Contact:    Lockie Pares, RN Phone Number: 12/14/2022, 9:47 AM  Clinical Narrative:                 Spoke to patient regarding medication and discharge needs. 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 He has trouble paying for his medications.  Will provide MATCH for patient . Patient also would like information sent to [facility] for possible medicaid screening. Message sent to [facility] for review. Please send medications to [facility] pharmacy ( if not on a weekend for DC)  Expected Discharge Plan: Home/Self Care Barriers to Discharge: Continued Medical Work up, Inadequate or no insurance   Patient Goals and CMS Choice    Home, MATCH assistance, [facility]        Expected Discharge Plan and Services In-house Referral: Artist Discharge Planning Services: Medication Assistance, MATCH Program   Living arrangements for the past 2 months: Single Family Home                                      Prior Living Arrangements/Services Living arrangements for the past 2 months: Single Family Home   Patient language and need for interpreter reviewed:: Yes        Need for Family Participation in Patient Care: Yes (Comment) Care giver support system in place?: Yes (comment)   Criminal Activity/Legal Involvement Pertinent to Current Situation/Hospitalization: No - Comment as needed  Activities of Daily Living Home Assistive Devices/Equipment: None ADL Screening (condition at time of admission) Patient's cognitive ability adequate to safely complete daily activities?: Yes Is the patient deaf or have difficulty hearing?: No Does the patient have difficulty seeing, even when wearing glasses/contacts?: No Does the patient have difficulty concentrating, remembering, or making decisions?:  No Patient able to express need for assistance with ADLs?: Yes Does the patient have difficulty dressing or bathing?: No Independently performs ADLs?: Yes (appropriate for developmental age) Does the patient have difficulty walking or climbing stairs?: Yes Weakness of Legs: Both Weakness of Arms/Hands: None  Permission Sought/Granted                  Emotional Assessment       Orientation: : Oriented to Self, Oriented to Place, Oriented to  Time, Oriented to Situation Alcohol / Substance Use: Not Applicable Psych Involvement: No (comment)  Admission diagnosis:  Cellulitis [L03.90] Cellulitis of right foot [L03.115] Patient Active Problem List   Diagnosis Date Noted   Cellulitis 12/13/2022   Tobacco abuse counseling 11/26/2022   Rash and other nonspecific skin eruption 11/26/2022   Itching 11/26/2022   Right foot pain 11/26/2022   Bilateral leg edema 11/26/2022   Encounter for examination following treatment at hospital 11/26/2022   Current mild episode of major depressive disorder (HCC) 11/26/2022   PCP:  Donell Beers, FNP Pharmacy:   [facility] [phone] - [place], [place] - [address] AT [facility] OF [address] [place] [address] Phone: [phone] Fax: [phone]     Social Determinants of Health (SDOH) Social History: SDOH Screenings   Food Insecurity: No Food Insecurity (12/13/2022)  Housing: Medium Risk (12/13/2022)  Transportation Needs: No Transportation Needs (12/13/2022)  Utilities:  Not At Risk (12/13/2022)  Depression (PHQ2-9): High Risk (11/26/2022)  Tobacco Use: High Risk (12/12/2022)   SDOH Interventions:     Readmission Risk Interventions     No data to display

## 2022-12-14 NOTE — Progress Notes (Signed)
PROGRESS NOTE    Timothy Mcdowell  UVO:[id number] DOB: Nov 27, 1971 DOA: 12/12/2022 PCP: Renee Rival, FNP  Outpatient Specialists:     Brief Narrative:  Patient is a 52 year old male with past medical history significant for hypertension.  Patient has been admitted with complicated soft tissue infection involving the right foot.  Apparently, patient stepped on a nail about 2 to 3 weeks prior to presentation.  Subsequently, patient developed swelling and pain of the ankle as well as skin changes dorsum of both feet.  Patient was seen by dermatology and was prescribed antifungal and steroid cream.  Despite the cream, patient's condition continued to deteriorate.  Patient is currently on IV vancomycin and cefepime.  Swelling and pain of the right lower extremity is improving.  No fever or chills.  Blood pressure is noted to be elevated.  12/14/2022: Cellulitis and edema have continued to improve.  Patient will continue antibiotics.  Patient will need to follow-up with dermatology on discharge for likely dermatitis.   Assessment & Plan:   Principal Problem:   Cellulitis   Cellulitis/soft tissue infection of the right foot: -Continue vancomycin and cefepime. -Improving.  MRI of the right foot revealed: 1. Extensive dorsal forefoot subcutaneous edema and ill-defined fluid consistent with cellulitis. 2. There are questionable small skin wounds along the dorsal aspect of the 1st web space and along the dorsal aspect of the 2nd proximal toe with adjacent tiny abscesses. No drainable fluid collections are identified. 3. No evidence of septic arthritis or osteomyelitis. 4. Mild degenerative changes in the midfoot and the tibial sesamoid of the 1st metatarsal.   Hypertension: -Uncontrolled. -Continue amlodipine and lisinopril.   -Goal blood pressure should be less than 130/80 mmHg.  DVT prophylaxis: Subcutaneous heparin Code Status: Full code Family Communication:  Disposition Plan:  Home eventually   Consultants:  None  Procedures:  None  Antimicrobials:  IV vancomycin IV cefepime   Subjective: Swelling of right foot has improved significantly.   Objective: Vitals:   12/13/22 2258 12/14/22 0445 12/14/22 0823 12/14/22 1541  BP: (!) 163/96 (!) 146/68 (!) 154/79 (!) 157/83  Pulse: 79 76 94 83  Resp: 19 18 16 17   Temp: 98.6 F (37 C) 99 F (37.2 C) 98.7 F (37.1 C) (!) 97.4 F (36.3 C)  TempSrc: Oral Oral Oral Oral  SpO2: 98% 98% 95% 98%  Weight:  79.9 kg    Height:        Intake/Output Summary (Last 24 hours) at 12/14/2022 1723 Last data filed at 12/14/2022 0147 Gross per 24 hour  Intake 351.2 ml  Output --  Net 351.2 ml    Filed Weights   12/12/22 1742 12/14/22 0445  Weight: 70.3 kg 79.9 kg    Examination:  General exam: Appears calm and comfortable  Respiratory system: Clear to auscultation.  Cardiovascular system: S1 & S2 heard. Gastrointestinal system: Abdomen is nondistended, soft and nontender. No organomegaly or masses felt. Normal bowel sounds heard. Central nervous system: Alert and oriented. No focal neurological deficits. Extremities: Improved swelling of the right foot.  Bilateral skin changes involving dorsum of both feet and the shin area, worse on the right side.   Data Reviewed: I have personally reviewed following labs and imaging studies  CBC: Recent Labs  Lab 12/12/22 1834 12/13/22 0816 12/14/22 0205  WBC 10.8* 11.6* 11.0*  NEUTROABS 8.3* 9.1* 8.7*  HGB 12.8* 12.4* 12.4*  HCT 40.2 37.4* 37.3*  MCV 60.0* 59.6* 59.4*  PLT 180 159 150    Basic  Metabolic Panel: Recent Labs  Lab 12/12/22 1834 12/13/22 0816 12/14/22 0205  NA 138 140 135  K 4.2 4.1 4.1  CL 107 107 102  CO2 25 25 23   GLUCOSE 68* 101* 89  BUN 13 10 15   CREATININE 1.09 1.10 1.02  CALCIUM 9.0 8.7* 8.6*  MG  --  2.0  --     GFR: Estimated Creatinine Clearance: 87.5 mL/min (by C-G formula based on SCr of 1.02 mg/dL). Liver Function  Tests: Recent Labs  Lab 12/13/22 0816 12/14/22 0205  AST 43* 66*  ALT 44 82*  ALKPHOS 61 61  BILITOT 1.2 1.3*  PROT 5.9* 6.0*  ALBUMIN 3.2* 3.1*    No results for input(s): "LIPASE", "AMYLASE" in the last 168 hours. No results for input(s): "AMMONIA" in the last 168 hours. Coagulation Profile: No results for input(s): "INR", "PROTIME" in the last 168 hours. Cardiac Enzymes: No results for input(s): "CKTOTAL", "CKMB", "CKMBINDEX", "TROPONINI" in the last 168 hours. BNP (last 3 results) No results for input(s): "PROBNP" in the last 8760 hours. HbA1C: No results for input(s): "HGBA1C" in the last 72 hours. CBG: Recent Labs  Lab 12/13/22 1123 12/13/22 2234 12/14/22 0821 12/14/22 1145 12/14/22 1648  GLUCAP 114* 116* 223* 136* 135*    Lipid Profile: No results for input(s): "CHOL", "HDL", "LDLCALC", "TRIG", "CHOLHDL", "LDLDIRECT" in the last 72 hours. Thyroid Function Tests: No results for input(s): "TSH", "T4TOTAL", "FREET4", "T3FREE", "THYROIDAB" in the last 72 hours. Anemia Panel: Recent Labs    12/13/22 0816  FERRITIN 208    Urine analysis:    Component Value Date/Time   COLORURINE AMBER (A) 08/11/2016 1151   APPEARANCEUR CLEAR 08/11/2016 1151   LABSPEC 1.026 08/11/2016 1151   PHURINE 7.0 08/11/2016 1151   GLUCOSEU NEGATIVE 08/11/2016 1151   HGBUR NEGATIVE 08/11/2016 1151   BILIRUBINUR SMALL (A) 08/11/2016 1151   KETONESUR 15 (A) 08/11/2016 1151   PROTEINUR NEGATIVE 08/11/2016 1151   NITRITE NEGATIVE 08/11/2016 1151   LEUKOCYTESUR NEGATIVE 08/11/2016 1151   Sepsis Labs: @LABRCNTIP (procalcitonin:4,lacticidven:4)  )No results found for this or any previous visit (from the past 240 hour(s)).       Radiology Studies: MR FOOT RIGHT W WO CONTRAST  Result Date: 12/13/2022 CLINICAL DATA:  Acute foot pain with limping and swelling for 2 weeks. Infection suspected. EXAM: MRI OF THE RIGHT FOREFOOT WITHOUT AND WITH CONTRAST TECHNIQUE: Multiplanar,  multisequence MR imaging of the right forefoot was performed before and after the administration of intravenous contrast. CONTRAST:  64mL GADAVIST GADOBUTROL 1 MMOL/ML IV SOLN COMPARISON:  Radiographs 12/12/2022 FINDINGS: Bones/Joint/Cartilage No evidence of acute fracture, dislocation, bone destruction or suspicious osseous enhancement. There are mild degenerative changes in the midfoot and the tibial sesamoid of the 1st metatarsal. No significant joint effusions or abnormal synovial enhancement. Ligaments The Lisfranc ligament is intact. The collateral ligaments of the metatarsophalangeal joints are intact. Muscles and Tendons The forefoot muscles appear normal. No abnormal muscular enhancement or intramuscular fluid collection. Tendons appear intact without significant tenosynovitis. Soft tissues Extensive dorsal subcutaneous edema and ill-defined fluid without focal fluid collection or abnormal enhancement. There are questionable small skin wounds along the dorsal aspect of the 1st web space and along the dorsal aspect of the 2nd proximal toe (most obvious on coronal image 13/4). There are small T2 hyperintense fluid collections in the underlying fat which demonstrate mild peripheral enhancement following contrast. These measure 8 mm maximally. No drainable fluid collections are identified. Incidental pressure lesion plantar to the 1st metatarsophalangeal joint. IMPRESSION:  1. Extensive dorsal forefoot subcutaneous edema and ill-defined fluid consistent with cellulitis. 2. There are questionable small skin wounds along the dorsal aspect of the 1st web space and along the dorsal aspect of the 2nd proximal toe with adjacent tiny abscesses. No drainable fluid collections are identified. 3. No evidence of septic arthritis or osteomyelitis. 4. Mild degenerative changes in the midfoot and the tibial sesamoid of the 1st metatarsal. Electronically Signed   By: Carey Bullocks M.D.   On: 12/13/2022 14:59   DG Foot  Complete Right  Result Date: 12/12/2022 CLINICAL DATA:  Pain EXAM: RIGHT FOOT COMPLETE - 3+ VIEW COMPARISON:  None Available. FINDINGS: No recent displaced fracture or dislocation is seen. No focal lytic lesions are seen. Small bony spurs seen in first metatarsophalangeal joint. There is 3 mm smooth marginated calcification in the dorsal aspect of navicular, possibly old avulsion or ligament calcification from previous injury. There is marked soft tissue swelling over the dorsum. No opaque foreign bodies are seen. IMPRESSION: No recent fracture or dislocation is seen. No focal lytic lesions are seen. There is 3 mm smooth marginated calcification adjacent to the dorsal margin of navicular, possibly residual change from previous injury. There is marked soft tissue swelling over the dorsum. If there is clinical suspicion for osteomyelitis, follow-up MRI may be considered. Electronically Signed   By: Ernie Avena M.D.   On: 12/12/2022 18:57        Scheduled Meds:  amLODipine  5 mg Oral Daily   heparin  5,000 Units Subcutaneous Q8H   lisinopril  20 mg Oral Daily   Continuous Infusions:  ceFEPime (MAXIPIME) IV 2 g (12/14/22 0942)   vancomycin 750 mg (12/14/22 1239)     LOS: 1 day    Time spent: 35 minutes    Berton Mount, MD  [facility] Pager #: [phone] 7PM-7AM contact night coverage as above

## 2022-12-15 LAB — GLUCOSE, CAPILLARY
Glucose-Capillary: 93 mg/dL (ref 70–99)
Glucose-Capillary: 162 mg/dL — ABNORMAL HIGH (ref 70–99)
Glucose-Capillary: 103 mg/dL — ABNORMAL HIGH (ref 70–99)
Glucose-Capillary: 107 mg/dL — ABNORMAL HIGH (ref 70–99)

## 2022-12-15 LAB — HEMOGLOBIN A1C
Hgb A1c MFr Bld: 5.4 % (ref 4.8–5.6)
Mean Plasma Glucose: 108 mg/dL

## 2022-12-15 MED ORDER — HYDROXYZINE HCL 10 MG/5ML PO SYRP
10.0000 mg | ORAL_SOLUTION | Freq: Three times a day (TID) | ORAL | Status: DC | PRN
  Administered 2022-12-15 – 2022-12-16 (×3): 10 mg via ORAL
  Filled 2022-12-15 (×5): qty 5

## 2022-12-15 NOTE — Progress Notes (Incomplete)
Initial Nutrition Assessment  DOCUMENTATION CODES:      INTERVENTION:     NUTRITION DIAGNOSIS:     related to   as evidenced by  .  GOAL:      MONITOR:      REASON FOR ASSESSMENT:   Malnutrition Screening Tool    ASSESSMENT:   52 y.o. male presented to the ED with swelling in RLE and skin changes x 2-3 weeks. PMH includes HTN. Pt admitted with cellulitis infection of R foot.     Medications reviewed and include: IV antibiotics  Labs reviewed.   NUTRITION - FOCUSED PHYSICAL EXAM:  {RD Focused Exam List:21252}  Diet Order:   Diet Order             Diet Heart Room service appropriate? Yes; Fluid consistency: Thin  Diet effective now                   EDUCATION NEEDS:      Skin:  Skin Assessment: Reviewed RN Assessment  Last BM:  1/4  Height:   Ht Readings from Last 1 Encounters:  12/12/22 5\' 10"  (1.778 m)    Weight:   Wt Readings from Last 1 Encounters:  12/15/22 78.1 kg    Ideal Body Weight:  75.5 kg  BMI:  Body mass index is 24.71 kg/m.  Estimated Nutritional Needs:   Kcal:     Protein:     Fluid:       Hermina Barters RD, LDN Clinical Dietitian See Shea Evans for contact information.

## 2022-12-15 NOTE — Progress Notes (Signed)
PROGRESS NOTE Jaxon Flatt  EPP:[id number] DOB: 1971/01/19 DOA: 12/12/2022 PCP: Donell Beers, FNP   Brief Narrative/Hospital Course: 52 year old male with past medical history significant for hypertension who apparently stepped on a nail about 2 to 3 weeks prior to presentation subsequently developed swelling and pain on the ankle and skin changes on the dorsum of the both feet, was seen by dermatology was prescribed antifungal and steroid cream despite which condition continued to deteriorate seen in the ED Patient was admitted with complicated soft tissue infection involving the right foot> managed with IV antibiotics.  MRI right foot with and without contrast extensive dorsum forefoot subcutaneous edema and ill-defined fluid consistent with cellulitis questionable small skin wounds along the dorsal aspect of the first webspace and along the dorsal aspect of the second proximal toe with adjacent tiny abscesses and no drainable fluid collection noted no evidence of septic arthritis or osteomyelitis.    Subjective: Seen and examined Feels better Overnight BP stable afebrile.  Labs reviewed from 1/7 mild transaminitis stable hb Has been itching/scratching his feet Bilateral lower extremity looks much improved in terms of swelling redness and excoriation.  Assessment and Plan: Principal Problem:   Cellulitis Complicated soft tissue infection involving the right foot, preceded by nail injury 2 to 3 weeks ago: MRI reviewed no evidence of drainable fluid collection or bony abnormalities.  Currently afebrile Dowless currently stable continue with vancomycin, cefepime.  Added Atarax for itching, swelling much improved, continue dressing/kerlex  Hypertension: BP fairly controlled, continue amlodipine 5 mg lisinopril  DVT prophylaxis: heparin injection 5,000 Units Start: 12/13/22 0800 Code Status:   Code Status: Full Code Family Communication: plan of care discussed with patient at  bedside. Patient status is: inpatient  because of rt foot infection Level of care: Telemetry Medical   Dispo: The patient is from: home            Anticipated disposition: Home In 2 days  Objective: Vitals last 24 hrs: Vitals:   12/14/22 1541 12/14/22 2048 12/15/22 0553 12/15/22 0736  BP: (!) 157/83 (!) 158/91 (!) 151/91 (!) 140/81  Pulse: 83 69 80 73  Resp: 17 18 18 16   Temp: (!) 97.4 F (36.3 C) 98.2 F (36.8 C) 98.6 F (37 C) 98 F (36.7 C)  TempSrc: Oral Oral Oral Oral  SpO2: 98% 99% 99% 97%  Weight:   78.1 kg   Height:       Weight change: -1.8 kg  Physical Examination: General exam: alert awake, older than stated age HEENT:Oral mucosa moist, Ear/Nose WNL grossly Respiratory system: bilaterally clear BS, no use of accessory muscle Cardiovascular system: S1 & S2 +, No JVD. Gastrointestinal system: Abdomen soft,NT,ND, BS+ Nervous System:Alert, awake, moving extremities. Extremities: Bilateral lower extremities/foot mild edema with excoriation and skin marked tenderness distal peripheral pulses palpable.  Skin: No rashes,no icterus. MSK: Normal muscle bulk,tone, power   Medications reviewed:  Scheduled Meds:  amLODipine  5 mg Oral Daily   heparin  5,000 Units Subcutaneous Q8H   lisinopril  20 mg Oral Daily   Continuous Infusions:  ceFEPime (MAXIPIME) IV 2 g (12/15/22 0806)   vancomycin 750 mg (12/15/22 0055)    Diet Order             Diet Heart Room service appropriate? Yes; Fluid consistency: Thin  Diet effective now                  No intake or output data in the 24 hours ending 12/15/22 1051 Net  IO Since Admission: 601.2 mL [12/15/22 1051]  Wt Readings from Last 3 Encounters:  12/15/22 78.1 kg  11/26/22 79.3 kg  06/03/22 86.2 kg     Unresulted Labs (From admission, onward)     Start     Ordered   12/14/22 0500  CBC  Tomorrow morning,   R        12/13/22 0750   12/13/22 0750  Hemoglobin A1c  Once,   R        12/13/22 0750   12/13/22 0749   CBC  (heparin)  Once,   R       Comments: Baseline for heparin therapy IF NOT ALREADY DRAWN.  Notify MD if PLT < 100 K.    12/13/22 0750          Data Reviewed: I have personally reviewed following labs and imaging studies CBC: Recent Labs  Lab 12/12/22 1834 12/13/22 0816 12/14/22 0205  WBC 10.8* 11.6* 11.0*  NEUTROABS 8.3* 9.1* 8.7*  HGB 12.8* 12.4* 12.4*  HCT 40.2 37.4* 37.3*  MCV 60.0* 59.6* 59.4*  PLT 180 159 283   Basic Metabolic Panel: Recent Labs  Lab 12/12/22 1834 12/13/22 0816 12/14/22 0205  NA 138 140 135  K 4.2 4.1 4.1  CL 107 107 102  CO2 25 25 23   GLUCOSE 68* 101* 89  BUN 13 10 15   CREATININE 1.09 1.10 1.02  CALCIUM 9.0 8.7* 8.6*  MG  --  2.0  --    GFR: Estimated Creatinine Clearance: 87.5 mL/min (by C-G formula based on SCr of 1.02 mg/dL). Liver Function Tests: Recent Labs  Lab 12/13/22 0816 12/14/22 0205  AST 43* 66*  ALT 44 82*  ALKPHOS 61 61  BILITOT 1.2 1.3*  PROT 5.9* 6.0*  ALBUMIN 3.2* 3.1*   Recent Labs  Lab 12/14/22 0821 12/14/22 1145 12/14/22 1648 12/14/22 2055 12/15/22 0805  GLUCAP 223* 136* 135* 104* 93   Recent Labs  Lab 12/13/22 0816  PROCALCITON <0.10    No results found for this or any previous visit (from the past 240 hour(s)).  Antimicrobials: Anti-infectives (From admission, onward)    Start     Dose/Rate Route Frequency Ordered Stop   12/13/22 1200  vancomycin (VANCOREADY) IVPB 750 mg/150 mL        750 mg 150 mL/hr over 60 Minutes Intravenous Every 12 hours 12/13/22 0606     12/13/22 0800  ceFEPIme (MAXIPIME) 2 g in sodium chloride 0.9 % 100 mL IVPB        2 g 200 mL/hr over 30 Minutes Intravenous Every 8 hours 12/13/22 0513     12/13/22 0030  vancomycin (VANCOCIN) IVPB 1000 mg/200 mL premix        1,000 mg 200 mL/hr over 60 Minutes Intravenous  Once 12/13/22 0016 12/13/22 0304   12/13/22 0030  ceFEPIme (MAXIPIME) 2 g in sodium chloride 0.9 % 100 mL IVPB        2 g 200 mL/hr over 30 Minutes  Intravenous  Once 12/13/22 0016 12/13/22 0120      Culture/Microbiology    Component Value Date/Time   SDES URINE, CLEAN CATCH 08/11/2016 1151   SPECREQUEST NONE 08/11/2016 1151   CULT NO GROWTH Performed at [facility]  08/11/2016 1151   REPTSTATUS 08/12/2016 FINAL 08/11/2016 1151  Radiology Studies: No results found.   LOS: 2 days   Antonieta Pert, MD [facility]  12/15/2022, 10:51 AM

## 2022-12-15 NOTE — Hospital Course (Addendum)
52 year old male with past medical history significant for hypertension who apparently stepped on a nail about 2 to 3 weeks prior to presentation subsequently developed swelling and pain on the ankle and skin changes on the dorsum of the both feet, was seen by dermatology was prescribed antifungal and steroid cream despite which condition continued to deteriorate seen in the ED Patient was admitted with complicated soft tissue infection involving the right foot> managed with IV antibiotics.  MRI right foot with and without contrast extensive dorsum forefoot subcutaneous edema and ill-defined fluid consistent with cellulitis questionable small skin wounds along the dorsal aspect of the first webspace and along the dorsal aspect of the second proximal toe with adjacent tiny abscesses and no drainable fluid collection noted no evidence of septic arthritis or osteomyelitis. He is feeling much improved overall..  Will mobilize him to see how he is doing to ensure that he is still for discharge home.  Additionally he does have dermatitis in his upper extremities and on his foot that needs to be followed by dermatology.  I did explain to him that given the dermatitis he will risk having secondary infection

## 2022-12-16 ENCOUNTER — Other Ambulatory Visit (HOSPITAL_COMMUNITY): Payer: Self-pay

## 2022-12-16 LAB — GLUCOSE, CAPILLARY
Glucose-Capillary: 111 mg/dL — ABNORMAL HIGH (ref 70–99)
Glucose-Capillary: 204 mg/dL — ABNORMAL HIGH (ref 70–99)
Glucose-Capillary: 152 mg/dL — ABNORMAL HIGH (ref 70–99)

## 2022-12-16 MED ORDER — FUROSEMIDE 20 MG PO TABS
20.0000 mg | ORAL_TABLET | Freq: Every day | ORAL | 0 refills | Status: DC | PRN
  Filled 2022-12-16: qty 10, 10d supply, fill #0

## 2022-12-16 MED ORDER — HYDROXYZINE PAMOATE 25 MG PO CAPS
25.0000 mg | ORAL_CAPSULE | Freq: Three times a day (TID) | ORAL | 0 refills | Status: DC | PRN
  Filled 2022-12-16: qty 30, 10d supply, fill #0

## 2022-12-16 MED ORDER — TRAMADOL HCL 50 MG PO TABS
50.0000 mg | ORAL_TABLET | Freq: Four times a day (QID) | ORAL | 0 refills | Status: AC | PRN
  Filled 2022-12-16: qty 15, 4d supply, fill #0

## 2022-12-16 MED ORDER — DOXYCYCLINE HYCLATE 100 MG PO TABS
100.0000 mg | ORAL_TABLET | Freq: Two times a day (BID) | ORAL | 0 refills | Status: AC
  Filled 2022-12-16: qty 14, 7d supply, fill #0

## 2022-12-16 MED ORDER — LISINOPRIL 20 MG PO TABS
20.0000 mg | ORAL_TABLET | Freq: Every day | ORAL | 0 refills | Status: DC
  Filled 2022-12-16: qty 30, 30d supply, fill #0

## 2022-12-16 MED ORDER — AMLODIPINE BESYLATE 5 MG PO TABS
5.0000 mg | ORAL_TABLET | Freq: Every day | ORAL | 0 refills | Status: DC
  Filled 2022-12-16: qty 30, 30d supply, fill #0

## 2022-12-16 MED ORDER — CEPHALEXIN 500 MG PO CAPS
500.0000 mg | ORAL_CAPSULE | Freq: Four times a day (QID) | ORAL | 0 refills | Status: AC
  Filled 2022-12-16: qty 28, 7d supply, fill #0

## 2022-12-16 NOTE — Progress Notes (Signed)
Orthopedic Tech Progress Note Patient Details:  Timothy Mcdowell 1971-10-17 [id number]  RN called requesting a pair of CRUTCHES   Ortho Devices Type of Ortho Device: Crutches Ortho Device/Splint Interventions: Ordered, Application, Adjustment   Post Interventions Patient Tolerated: Well, Ambulated well Instructions Provided: Care of device  [name] 12/16/2022, 12:04 PM

## 2022-12-16 NOTE — Progress Notes (Signed)
Pharmacy Antibiotic Note  Timothy Mcdowell is a 52 y.o. male admitted on 12/12/2022 with cellulitis.  Pharmacy has been consulted for vancomycin dosing.  Patient is also on cefepime.  Renal function stable, afebrile, WBC WNL.  Plan: Vanc 750mg  IV Q12H for AUC 425 Cefepime 2gm IV Q8H per MD Monitor renal fxn, clinical progress, vanc levels soon if still on therapy  Height: 5\' 10"  (177.8 cm) Weight: 78 kg (171 lb 15.3 oz) IBW/kg (Calculated) : 73  Temp (24hrs), Avg:98.2 F (36.8 C), Min:97.6 F (36.4 C), Max:98.7 F (37.1 C)  Recent Labs  Lab 12/12/22 1834 12/13/22 0816 12/14/22 0205  WBC 10.8* 11.6* 11.0*  CREATININE 1.09 1.10 1.02     Estimated Creatinine Clearance: 87.5 mL/min (by C-G formula based on SCr of 1.02 mg/dL).    Allergies  Allergen Reactions   Other Rash    strawberries    Cefepime 1/6 >>  Vanc 1/6 >>   Latrelle Fuston D. Mina Marble, PharmD, BCPS, [place] 12/16/2022, 12:49 PM

## 2022-12-16 NOTE — Discharge Summary (Signed)
Physician Discharge Summary  Timothy Mcdowell KNL:[id number] DOB: 01-18-1971 DOA: 12/12/2022  PCP: Donell Beers, FNP  Admit date: 12/12/2022 Discharge date: 12/16/2022 Recommendations for Outpatient Follow-up:  Follow up with PCP in 1 weeks-call for appointment Please obtain BMP/CBC in one week  Discharge Dispo: home Discharge Condition: Stable Code Status:   Code Status: Full Code Diet recommendation:  Diet Order             Diet Heart Room service appropriate? Yes; Fluid consistency: Thin  Diet effective now                    Brief/Interim Summary: 52 year old male with past medical history significant for hypertension who apparently stepped on a nail about 2 to 3 weeks prior to presentation subsequently developed swelling and pain on the ankle and skin changes on the dorsum of the both feet, was seen by dermatology was prescribed antifungal and steroid cream despite which condition continued to deteriorate seen in the ED Patient was admitted with complicated soft tissue infection involving the right foot> managed with IV antibiotics.  MRI right foot with and without contrast extensive dorsum forefoot subcutaneous edema and ill-defined fluid consistent with cellulitis questionable small skin wounds along the dorsal aspect of the first webspace and along the dorsal aspect of the second proximal toe with adjacent tiny abscesses and no drainable fluid collection noted no evidence of septic arthritis or osteomyelitis. He is feeling much improved overall..  Will mobilize him to see how he is doing to ensure that he is still for discharge home.  Additionally he does have dermatitis in his upper extremities and on his foot that needs to be followed by dermatology.  I did explain to him that given the dermatitis he will risk having secondary infection Patient was given crutches with him as he mobilized well and he feels comfortable going home tonight  Discharge Diagnoses:  Principal  Problem:   Cellulitis  Complicated soft tissue infection involving the right foot, preceded by nail injury 2 to 3 weeks ago: MRI reviewed no evidence of drainable fluid collection or bony abnormalities.  Overall he has clinically improved today with IV antibiotics.  Strongly encouraged that he needs his dermatitis addressed by the dermatologist soon upon discharge, continue on oral antibiotics and continue to follow-up with PCP continue wound care at home continue Atarax for itching.    Hypertension: BP now well-controlled on current new medication prescription provided   Consults: None  Subjective: aaox3  Discharge Exam: Vitals:   12/16/22 0852 12/16/22 0856  BP: (!) 156/90 (!) 159/86  Pulse: 72 75  Resp: 18 18  Temp: 97.6 F (36.4 C) 97.7 F (36.5 C)  SpO2: 99% 98%   General: Pt is alert, awake, not in acute distress Cardiovascular: RRR, S1/S2 +, no rubs, no gallops Respiratory: CTA bilaterally, no wheezing, no rhonchi Abdominal: Soft, NT, ND, bowel sounds + Extremities: no edema, no cyanosis  Discharge Instructions  Discharge Instructions     Discharge instructions   Complete by: As directed    You need to follow-up with your dermatology as soon as possible call the office for August appointment  Please call call MD or return to ER for similar or worsening recurring problem that brought you to hospital or if any fever,nausea/vomiting,abdominal pain, uncontrolled pain, chest pain,  shortness of breath or any other alarming symptoms.  Please follow-up your doctor as instructed in a week time and call the office for appointment.  Please avoid alcohol,  smoking, or any other illicit substance and maintain healthy habits including taking your regular medications as prescribed.  You were cared for by a hospitalist during your hospital stay. If you have any questions about your discharge medications or the care you received while you were in the hospital after you are  discharged, you can call the unit and ask to speak with the hospitalist on call if the hospitalist that took care of you is not available.  Once you are discharged, your primary care physician will handle any further medical issues. Please note that NO REFILLS for any discharge medications will be authorized once you are discharged, as it is imperative that you return to your primary care physician (or establish a relationship with a primary care physician if you do not have one) for your aftercare needs so that they can reassess your need for medications and monitor your lab values   Increase activity slowly   Complete by: As directed       Allergies as of 12/16/2022       Reactions   Other Rash   strawberries        Medication List     STOP taking these medications    predniSONE 10 MG (21) Tbpk tablet Commonly known as: STERAPRED UNI-PAK 21 TAB       TAKE these medications    amLODipine 5 MG tablet Commonly known as: NORVASC Take 1 tablet (5 mg total) by mouth daily. Start taking on: December 17, 2022   cephALEXin 500 MG capsule Commonly known as: KEFLEX Take 1 capsule (500 mg total) by mouth 4 (four) times daily for 7 days.   clotrimazole 1 % cream Commonly known as: LOTRIMIN Apply 1 Application topically 2 (two) times daily.   doxycycline 100 MG tablet Commonly known as: ADOXA Take 1 tablet (100 mg total) by mouth 2 (two) times daily for 7 days.   furosemide 20 MG tablet Commonly known as: LASIX Take 1 tablet (20 mg total) by mouth daily as needed for up to 10 days. Take with potasium chloride   hydrOXYzine 25 MG capsule Commonly known as: VISTARIL Take 1 capsule (25 mg total) by mouth every 8 (eight) hours as needed for up to 30 doses.   lisinopril 20 MG tablet Commonly known as: ZESTRIL Take 1 tablet (20 mg total) by mouth daily. Start taking on: December 17, 2022   potassium chloride SA 20 MEQ tablet Commonly known as: KLOR-CON M Take 1 tablet  (20 mEq total) by mouth daily as needed. Take whenever you take lasix 20mg  daily as needed   traMADol 50 MG tablet Commonly known as: Ultram Take 1 tablet (50 mg total) by mouth every 6 (six) hours as needed for up to 5 days for moderate pain or severe pain.   triamcinolone cream 0.1 % Commonly known as: KENALOG Apply 1 Application topically 2 (two) times daily.               Durable Medical Equipment  (From admission, onward)           Start     Ordered   12/16/22 1007  For home use only DME Crutches  Once        12/16/22 1007            Follow-up Information     [place], 02/14/23, FNP Follow up in 1 week(s).   Specialty: Nurse Practitioner Contact information: [address] [place] [phone]  Allergies  Allergen Reactions   Other Rash    strawberries    The results of significant diagnostics from this hospitalization (including imaging, microbiology, ancillary and laboratory) are listed below for reference.    Microbiology: No results found for this or any previous visit (from the past 240 hour(s)).  Procedures/Studies: MR FOOT RIGHT W WO CONTRAST  Result Date: 12/13/2022 CLINICAL DATA:  Acute foot pain with limping and swelling for 2 weeks. Infection suspected. EXAM: MRI OF THE RIGHT FOREFOOT WITHOUT AND WITH CONTRAST TECHNIQUE: Multiplanar, multisequence MR imaging of the right forefoot was performed before and after the administration of intravenous contrast. CONTRAST:  62mL GADAVIST GADOBUTROL 1 MMOL/ML IV SOLN COMPARISON:  Radiographs 12/12/2022 FINDINGS: Bones/Joint/Cartilage No evidence of acute fracture, dislocation, bone destruction or suspicious osseous enhancement. There are mild degenerative changes in the midfoot and the tibial sesamoid of the 1st metatarsal. No significant joint effusions or abnormal synovial enhancement. Ligaments The Lisfranc ligament is intact. The collateral  ligaments of the metatarsophalangeal joints are intact. Muscles and Tendons The forefoot muscles appear normal. No abnormal muscular enhancement or intramuscular fluid collection. Tendons appear intact without significant tenosynovitis. Soft tissues Extensive dorsal subcutaneous edema and ill-defined fluid without focal fluid collection or abnormal enhancement. There are questionable small skin wounds along the dorsal aspect of the 1st web space and along the dorsal aspect of the 2nd proximal toe (most obvious on coronal image 13/4). There are small T2 hyperintense fluid collections in the underlying fat which demonstrate mild peripheral enhancement following contrast. These measure 8 mm maximally. No drainable fluid collections are identified. Incidental pressure lesion plantar to the 1st metatarsophalangeal joint. IMPRESSION: 1. Extensive dorsal forefoot subcutaneous edema and ill-defined fluid consistent with cellulitis. 2. There are questionable small skin wounds along the dorsal aspect of the 1st web space and along the dorsal aspect of the 2nd proximal toe with adjacent tiny abscesses. No drainable fluid collections are identified. 3. No evidence of septic arthritis or osteomyelitis. 4. Mild degenerative changes in the midfoot and the tibial sesamoid of the 1st metatarsal. Electronically Signed   By: Carey Bullocks M.D.   On: 12/13/2022 14:59   DG Foot Complete Right  Result Date: 12/12/2022 CLINICAL DATA:  Pain EXAM: RIGHT FOOT COMPLETE - 3+ VIEW COMPARISON:  None Available. FINDINGS: No recent displaced fracture or dislocation is seen. No focal lytic lesions are seen. Small bony spurs seen in first metatarsophalangeal joint. There is 3 mm smooth marginated calcification in the dorsal aspect of navicular, possibly old avulsion or ligament calcification from previous injury. There is marked soft tissue swelling over the dorsum. No opaque foreign bodies are seen. IMPRESSION: No recent fracture or  dislocation is seen. No focal lytic lesions are seen. There is 3 mm smooth marginated calcification adjacent to the dorsal margin of navicular, possibly residual change from previous injury. There is marked soft tissue swelling over the dorsum. If there is clinical suspicion for osteomyelitis, follow-up MRI may be considered. Electronically Signed   By: Ernie Avena M.D.   On: 12/12/2022 18:57    Labs: BNP (last 3 results) No results for input(s): "BNP" in the last 8760 hours. Basic Metabolic Panel: Recent Labs  Lab 12/12/22 1834 12/13/22 0816 12/14/22 0205  NA 138 140 135  K 4.2 4.1 4.1  CL 107 107 102  CO2 25 25 23   GLUCOSE 68* 101* 89  BUN 13 10 15   CREATININE 1.09 1.10 1.02  CALCIUM 9.0 8.7* 8.6*  MG  --  2.0  --  Liver Function Tests: Recent Labs  Lab 12/13/22 0816 12/14/22 0205  AST 43* 66*  ALT 44 82*  ALKPHOS 61 61  BILITOT 1.2 1.3*  PROT 5.9* 6.0*  ALBUMIN 3.2* 3.1*   No results for input(s): "LIPASE", "AMYLASE" in the last 168 hours. No results for input(s): "AMMONIA" in the last 168 hours. CBC: Recent Labs  Lab 12/12/22 1834 12/13/22 0816 12/14/22 0205  WBC 10.8* 11.6* 11.0*  NEUTROABS 8.3* 9.1* 8.7*  HGB 12.8* 12.4* 12.4*  HCT 40.2 37.4* 37.3*  MCV 60.0* 59.6* 59.4*  PLT 180 159 150   Cardiac Enzymes: No results for input(s): "CKTOTAL", "CKMB", "CKMBINDEX", "TROPONINI" in the last 168 hours. BNP: Invalid input(s): "POCBNP" CBG: Recent Labs  Lab 12/15/22 1202 12/15/22 1707 12/15/22 2123 12/16/22 0855 12/16/22 1131  GLUCAP 162* 103* 107* 111* 204*   D-Dimer No results for input(s): "DDIMER" in the last 72 hours. Hgb A1c No results for input(s): "HGBA1C" in the last 72 hours. Lipid Profile No results for input(s): "CHOL", "HDL", "LDLCALC", "TRIG", "CHOLHDL", "LDLDIRECT" in the last 72 hours. Thyroid function studies No results for input(s): "TSH", "T4TOTAL", "T3FREE", "THYROIDAB" in the last 72 hours.  Invalid input(s):  "FREET3" Anemia work up No results for input(s): "VITAMINB12", "FOLATE", "FERRITIN", "TIBC", "IRON", "RETICCTPCT" in the last 72 hours. Urinalysis    Component Value Date/Time   COLORURINE AMBER (A) 08/11/2016 1151   APPEARANCEUR CLEAR 08/11/2016 1151   LABSPEC 1.026 08/11/2016 1151   PHURINE 7.0 08/11/2016 1151   GLUCOSEU NEGATIVE 08/11/2016 1151   HGBUR NEGATIVE 08/11/2016 1151   BILIRUBINUR SMALL (A) 08/11/2016 1151   KETONESUR 15 (A) 08/11/2016 1151   PROTEINUR NEGATIVE 08/11/2016 1151   NITRITE NEGATIVE 08/11/2016 1151   LEUKOCYTESUR NEGATIVE 08/11/2016 1151   Sepsis Labs Recent Labs  Lab 12/12/22 1834 12/13/22 0816 12/14/22 0205  WBC 10.8* 11.6* 11.0*   Microbiology No results found for this or any previous visit (from the past 240 hour(s)).  Time coordinating discharge: 25 minutes  SIGNED: Antonieta Pert, MD  [facility] 12/16/2022, 1:50 PM  If 7PM-7AM, please contact night-coverage [email or web address]

## 2022-12-17 ENCOUNTER — Telehealth: Payer: Self-pay

## 2022-12-17 NOTE — Telephone Encounter (Signed)
Transition Care Management Unsuccessful Follow-up Telephone Call  Date of discharge and from where:  [facility] 12/16/2022  Attempts:  1st Attempt  Reason for unsuccessful TCM follow-up call:  No answer/busy Juanda Crumble, [place] Direct Dial [phone]

## 2022-12-18 NOTE — Telephone Encounter (Signed)
Transition Care Management Unsuccessful Follow-up Telephone Call  Date of discharge and from where:  [facility] 12/16/2022  Attempts:  2nd Attempt  Reason for unsuccessful TCM follow-up call:  Unable to leave message Juanda Crumble, [place] Direct Dial [phone]

## 2022-12-19 NOTE — Telephone Encounter (Signed)
Transition Care Management Unsuccessful Follow-up Telephone Call  Date of discharge and from where:  [facility] 12/16/2022  Attempts:  3rd Attempt  Reason for unsuccessful TCM follow-up call:  Unable to leave message Juanda Crumble, [place] Direct Dial [phone]

## 2022-12-29 ENCOUNTER — Encounter: Payer: Self-pay | Admitting: Nurse Practitioner

## 2022-12-29 ENCOUNTER — Ambulatory Visit (INDEPENDENT_AMBULATORY_CARE_PROVIDER_SITE_OTHER): Payer: Medicaid Other | Admitting: Nurse Practitioner

## 2022-12-29 VITALS — BP 132/67 | HR 85 | Temp 97.7°F | Wt 175.2 lb

## 2022-12-29 DIAGNOSIS — R600 Localized edema: Secondary | ICD-10-CM

## 2022-12-29 DIAGNOSIS — I10 Essential (primary) hypertension: Secondary | ICD-10-CM

## 2022-12-29 DIAGNOSIS — L299 Pruritus, unspecified: Secondary | ICD-10-CM

## 2022-12-29 DIAGNOSIS — L03818 Cellulitis of other sites: Secondary | ICD-10-CM

## 2022-12-29 DIAGNOSIS — F331 Major depressive disorder, recurrent, moderate: Secondary | ICD-10-CM

## 2022-12-29 DIAGNOSIS — Z720 Tobacco use: Secondary | ICD-10-CM

## 2022-12-29 DIAGNOSIS — M79671 Pain in right foot: Secondary | ICD-10-CM

## 2022-12-29 DIAGNOSIS — Z09 Encounter for follow-up examination after completed treatment for conditions other than malignant neoplasm: Secondary | ICD-10-CM

## 2022-12-29 DIAGNOSIS — G8929 Other chronic pain: Secondary | ICD-10-CM

## 2022-12-29 DIAGNOSIS — M79672 Pain in left foot: Secondary | ICD-10-CM

## 2022-12-29 DIAGNOSIS — R21 Rash and other nonspecific skin eruption: Secondary | ICD-10-CM | POA: Diagnosis not present

## 2022-12-29 MED ORDER — AMLODIPINE BESYLATE 5 MG PO TABS: 5.0000 mg | ORAL_TABLET | Freq: Every day | ORAL | 1 refills | Status: AC

## 2022-12-29 MED ORDER — TRIAMCINOLONE ACETONIDE 0.1 % EX CREA: 1.0000 | TOPICAL_CREAM | Freq: Two times a day (BID) | CUTANEOUS | 0 refills | Status: DC

## 2022-12-29 MED ORDER — IBUPROFEN 600 MG PO TABS: 600.0000 mg | ORAL_TABLET | Freq: Three times a day (TID) | ORAL | 0 refills | Status: AC | PRN

## 2022-12-29 MED ORDER — LISINOPRIL 20 MG PO TABS: 20.0000 mg | ORAL_TABLET | Freq: Every day | ORAL | 1 refills | Status: AC

## 2022-12-29 MED ORDER — HYDROXYZINE PAMOATE 25 MG PO CAPS: 25.0000 mg | ORAL_CAPSULE | Freq: Three times a day (TID) | ORAL | 1 refills | Status: AC | PRN

## 2022-12-29 NOTE — Patient Instructions (Addendum)
1. Rash and other nonspecific skin eruption  - triamcinolone cream (KENALOG) 0.1 %; Apply 1 Application topically 2 (two) times daily.  Dispense: 45 g; Refill: 0  2. Primary hypertension  - Basic Metabolic Panel - CBC - amLODipine (NORVASC) 5 MG tablet; Take 1 tablet (5 mg total) by mouth daily.  Dispense: 90 tablet; Refill: 1 - lisinopril (ZESTRIL) 20 MG tablet; Take 1 tablet (20 mg total) by mouth daily.  Dispense: 90 tablet; Refill: 1   3. Itching  - hydrOXYzine (VISTARIL) 25 MG capsule; Take 1 capsule (25 mg total) by mouth every 8 (eight) hours as needed for itching.  Dispense: 30 capsule; Refill: 1  4 Chronic pain of both feet  - ibuprofen (ADVIL) 600 MG tablet; Take 1 tablet (600 mg total) by mouth every 8 (eight) hours as needed.  Dispense: 30 tablet; Refill: 0 - Ambulatory referral to Podiatry    It is important that you exercise regularly at least 30 minutes 5 times a week as tolerated  Think about what you will eat, plan ahead. Choose " clean, green, fresh or frozen" over canned, processed or packaged foods which are more sugary, salty and fatty. 70 to 75% of food eaten should be vegetables and fruit. Three meals at set times with snacks allowed between meals, but they must be fruit or vegetables. Aim to eat over a 12 hour period , example 7 am to 7 pm, and STOP after  your last meal of the day. Drink water,generally about 64 ounces per day, no other drink is as healthy. Fruit juice is best enjoyed in a healthy way, by EATING the fruit.  Thanks for choosing [facility] we consider it a privelige to serve you.

## 2022-12-29 NOTE — Assessment & Plan Note (Signed)
BP Readings from Last 3 Encounters:  12/29/22 132/67  12/16/22 (!) 160/98  11/26/22 139/75  Currently well-controlled on amlodipine 5 mg daily, lisinopril 20 mg daily, medications refilled. DASH diet advised engage in regular moderate exercises at least 250 minutes weekly as tolerated. BMP today. Follow-up in 4

## 2022-12-29 NOTE — Assessment & Plan Note (Signed)
Ibuprofen 600 mg every 8 hours as needed reordered Patient encouraged to follow-up with podiatry.

## 2022-12-29 NOTE — Progress Notes (Signed)
Established Patient Office Visit  Subjective:  Patient ID: Timothy Mcdowell, male    DOB: [birth year]  Age: 52 y.o. MRN: [id number]  CC:  Chief Complaint  Patient presents with   Follow-up    Swelling has decreased but pain is still there.     HPI Timothy Mcdowell is a 52 y.o. male with past medical history of hypertension, presents for hospital admission follow-up.  Patient was on admission at the hospital from January.  2024 to December 16, 2022 for cellulitis.  He was treated with IV antibiotics during his hospital stay, discharged home on Keflex 500 mg 4 times daily, doxycycline 100 mg twice daily for 7 days.  Patient stated that he has completed the full course of  antibiotics..  His feet pain feels much better but continues to have feet pain worse at night and during ambulation at night.  He describes the pain as throbbing pain 10/10 when its bad.  He has been taking Tylenol as needed he denies fever, chills, shortness of breath, malaise.  Hypertension.  Currently on lisinopril 20 mg daily, amlodipine 5 mg daily, patient denies any adverse reactions to this medication.      Past Medical History:  Diagnosis Date   Hypertension     No past surgical history on file.  Family History  Problem Relation Age of Onset   Hypertension Mother     Social History   Socioeconomic History   Marital status: Single    Spouse name: Not on file   Number of children: 1   Years of education: Not on file   Highest education level: Not on file  Occupational History   Not on file  Tobacco Use   Smoking status: Every Day   Smokeless tobacco: Never   Tobacco comments:    A pack last every 2 days , age 53 .   Substance and Sexual Activity   Alcohol use: No   Drug use: Yes    Types: Marijuana   Sexual activity: Not on file  Other Topics Concern   Not on file  Social History Narrative   Lives with a friend   Social Determinants of Health   Financial Resource Strain: Not on file  Food  Insecurity: No Food Insecurity (12/13/2022)   Hunger Vital Sign    Worried About Running Out of Food in the Last Year: Never true    Ran Out of Food in the Last Year: Never true  Transportation Needs: No Transportation Needs (12/13/2022)   PRAPARE - Administrator, Civil Service (Medical): No    Lack of Transportation (Non-Medical): No  Physical Activity: Not on file  Stress: Not on file  Social Connections: Not on file  Intimate Partner Violence: Not At Risk (12/13/2022)   Humiliation, Afraid, Rape, and Kick questionnaire    Fear of Current or Ex-Partner: No    Emotionally Abused: No    Physically Abused: No    Sexually Abused: No    Outpatient Medications Prior to Visit  Medication Sig Dispense Refill   acetaminophen (TYLENOL) 500 MG tablet Take 500 mg by mouth every 6 (six) hours as needed.     amLODipine (NORVASC) 5 MG tablet Take 1 tablet (5 mg total) by mouth daily. 30 tablet 0   hydrOXYzine (VISTARIL) 25 MG capsule Take 1 capsule (25 mg total) by mouth every 8 (eight) hours as needed for up to 30 doses. 30 capsule 0   lisinopril (ZESTRIL) 20 MG tablet Take 1  tablet (20 mg total) by mouth daily. 30 tablet 0   clotrimazole (LOTRIMIN) 1 % cream Apply 1 Application topically 2 (two) times daily. (Patient not taking: Reported on 12/13/2022)     furosemide (LASIX) 20 MG tablet Take 1 tablet (20 mg total) by mouth daily as needed for up to 10 days. Take with potasium chloride (Patient not taking: Reported on 12/29/2022) 10 tablet 0   potassium chloride SA (KLOR-CON M) 20 MEQ tablet Take 1 tablet (20 mEq total) by mouth daily as needed. Take whenever you take lasix 20mg  daily as needed (Patient not taking: Reported on 12/29/2022) 20 tablet 0   triamcinolone cream (KENALOG) 0.1 % Apply 1 Application topically 2 (two) times daily. (Patient not taking: Reported on 12/13/2022) 30 g 1   No facility-administered medications prior to visit.    Allergies  Allergen Reactions   Other  Rash    strawberries    ROS Review of Systems  Constitutional:  Negative for activity change, appetite change, chills, diaphoresis, fatigue, fever and unexpected weight change.  Respiratory: Negative.  Negative for cough, choking, chest tightness, shortness of breath and wheezing.   Cardiovascular:  Positive for leg swelling. Negative for chest pain and palpitations.  Skin:  Positive for rash.  Neurological:  Negative for dizziness, facial asymmetry, light-headedness, numbness and headaches.  Psychiatric/Behavioral:  Negative for agitation, behavioral problems, confusion, decreased concentration, self-injury and suicidal ideas.       Objective:    Physical Exam Constitutional:      General: He is not in acute distress.    Appearance: He is not ill-appearing, toxic-appearing or diaphoretic.  Eyes:     General: No scleral icterus.       Right eye: No discharge.        Left eye: No discharge.     Extraocular Movements: Extraocular movements intact.  Cardiovascular:     Rate and Rhythm: Normal rate and regular rhythm.     Pulses: Normal pulses.     Heart sounds: Normal heart sounds. No murmur heard.    No friction rub. No gallop.  Pulmonary:     Effort: Pulmonary effort is normal. No respiratory distress.     Breath sounds: Normal breath sounds. No stridor. No wheezing, rhonchi or rales.  Chest:     Chest wall: No tenderness.  Abdominal:     General: There is no distension.     Palpations: Abdomen is soft.     Tenderness: There is no abdominal tenderness. There is no guarding.  Musculoskeletal:        General: Tenderness present.     Right lower leg: Edema present.     Left lower leg: Edema present.     Comments: Swelling much improved from last visit, no redness or drainage noted on examination skin warm and dry. Walking with crutches  Skin:    Comments: thickened scaly itching plaques noted on bilateral lower extremities Nonpitting edema to bilateral lower extremities,  Skin is warm, moist..  Nails of bilateral toes appear thickened and discolored.    Neurological:     Mental Status: He is alert and oriented to person, place, and time.     Motor: No weakness.     Coordination: Coordination normal.     Gait: Gait abnormal.  Psychiatric:        Mood and Affect: Mood normal.        Behavior: Behavior normal.        Thought Content: Thought content normal.  Judgment: Judgment normal.     BP 132/67   Pulse 85   Temp 97.7 F (36.5 C)   Wt 175 lb 3.2 oz (79.5 kg)   SpO2 100%   BMI 25.14 kg/m  Wt Readings from Last 3 Encounters:  12/29/22 175 lb 3.2 oz (79.5 kg)  12/16/22 171 lb 15.3 oz (78 kg)  11/26/22 174 lb 12.8 oz (79.3 kg)    No results found for: "TSH" Lab Results  Component Value Date   WBC 11.0 (H) 12/14/2022   HGB 12.4 (L) 12/14/2022   HCT 37.3 (L) 12/14/2022   MCV 59.4 (L) 12/14/2022   PLT 150 12/14/2022   Lab Results  Component Value Date   NA 135 12/14/2022   K 4.1 12/14/2022   CO2 23 12/14/2022   GLUCOSE 89 12/14/2022   BUN 15 12/14/2022   CREATININE 1.02 12/14/2022   BILITOT 1.3 (H) 12/14/2022   ALKPHOS 61 12/14/2022   AST 66 (H) 12/14/2022   ALT 82 (H) 12/14/2022   PROT 6.0 (L) 12/14/2022   ALBUMIN 3.1 (L) 12/14/2022   CALCIUM 8.6 (L) 12/14/2022   ANIONGAP 10 12/14/2022   No results found for: "CHOL" No results found for: "HDL" No results found for: "LDLCALC" No results found for: "TRIG" No results found for: "CHOLHDL" Lab Results  Component Value Date   HGBA1C 5.4 12/13/2022      Assessment & Plan:   Problem List Items Addressed This Visit       Cardiovascular and Mediastinum   High blood pressure    BP Readings from Last 3 Encounters:  12/29/22 132/67  12/16/22 (!) 160/98  11/26/22 139/75  Currently well-controlled on amlodipine 5 mg daily, lisinopril 20 mg daily, medications refilled. DASH diet advised engage in regular moderate exercises at least 250 minutes weekly as tolerated. BMP  today. Follow-up in 4      Relevant Medications   amLODipine (NORVASC) 5 MG tablet   lisinopril (ZESTRIL) 20 MG tablet   Other Relevant Orders   Basic Metabolic Panel   CBC     Musculoskeletal and Integument   Rash and other nonspecific skin eruption    Referral to dermatologist is pending Kenalog 0.1% cream apply 2 times daily, hydroxyzine 25 mg every 8 hours as needed for itching ordered.  Avoid scratching the rashes to prevent infection      Relevant Medications   triamcinolone cream (KENALOG) 0.1 %     Other   Itching   Relevant Medications   hydrOXYzine (VISTARIL) 25 MG capsule   Depression       12/29/2022    1:33 PM 11/26/2022    1:16 PM  Depression screen PHQ 2/9  Decreased Interest 1 0  Down, Depressed, Hopeless 3 2  PHQ - 2 Score 4 2  Altered sleeping 3 3  Tired, decreased energy 2 2  Change in appetite 3 0  Feeling bad or failure about yourself  3 1  Trouble concentrating 0 0  Moving slowly or fidgety/restless 3 3  Suicidal thoughts 0 0  PHQ-9 Score 18 11  Difficult doing work/chores Very difficult Somewhat difficult  He was referred for counseling during his last visit but they were unable to contact him to schedule an appointment.  Appointment scheduled today He denies SI,HI       Relevant Medications   hydrOXYzine (VISTARIL) 25 MG capsule   Cellulitis - Primary    Of right foot now resolved.  No redness or drainage noted on examination of  bilateral foot today, denies fever, chills, malaise.  Has completed full course of antibiotics ordered after  his hospital stay      Relevant Orders   [facility] discharge follow-up    atient was on admission at the hospital from January.  2024 to December 16, 2022 for cellulitis.  He was treated with IV antibiotics during his hospital stay, discharged home on Keflex 500 mg 4 times daily, doxycycline 100 mg twice daily for 7 days.  Patient stated that he has been sick for most of the  antibiotics..  His feet pain feels much better but continues to have feet pain worse at night and during ambulation at night.  He describes the pain as throbbing 10/10.  He has been taking Tylenol as needed he denies fever, chills, shortness of breath, malaise. After visit summary, imaging studies and labs were reviewed by me today      Tobacco abuse    Smokes about 0.5 pack/day  Asked about quitting: confirms that he/she currently smokes cigarettes Advise to quit smoking: Educated about QUITTING to reduce the risk of cancer, cardio and cerebrovascular disease. Assess willingness: Unwilling to quit at this time, but is working on cutting back. Assist with counseling and pharmacotherapy: Counseled for 5 minutes  Arrange for follow up: follow up in 4 months and continue to offer help.       Other Visit Diagnoses     Chronic pain of both feet       Relevant Medications   acetaminophen (TYLENOL) 500 MG tablet   ibuprofen (ADVIL) 600 MG tablet   Other Relevant Orders   Ambulatory referral to Podiatry       Meds ordered this encounter  Medications   amLODipine (NORVASC) 5 MG tablet    Sig: Take 1 tablet (5 mg total) by mouth daily.    Dispense:  90 tablet    Refill:  1   lisinopril (ZESTRIL) 20 MG tablet    Sig: Take 1 tablet (20 mg total) by mouth daily.    Dispense:  90 tablet    Refill:  1   hydrOXYzine (VISTARIL) 25 MG capsule    Sig: Take 1 capsule (25 mg total) by mouth every 8 (eight) hours as needed for itching.    Dispense:  30 capsule    Refill:  1   triamcinolone cream (KENALOG) 0.1 %    Sig: Apply 1 Application topically 2 (two) times daily.    Dispense:  45 g    Refill:  0    Please do not fill RX for clotrimazole   ibuprofen (ADVIL) 600 MG tablet    Sig: Take 1 tablet (600 mg total) by mouth every 8 (eight) hours as needed.    Dispense:  30 tablet    Refill:  0    Follow-up: Return in about 4 months (around 04/29/2023).    Renee Rival, FNP

## 2022-12-29 NOTE — Assessment & Plan Note (Signed)
atient was on admission at the hospital from January.  2024 to December 16, 2022 for cellulitis.  He was treated with IV antibiotics during his hospital stay, discharged home on Keflex 500 mg 4 times daily, doxycycline 100 mg twice daily for 7 days.  Patient stated that he has been sick for most of the antibiotics..  His feet pain feels much better but continues to have feet pain worse at night and during ambulation at night.  He describes the pain as throbbing 10/10.  He has been taking Tylenol as needed he denies fever, chills, shortness of breath, malaise. After visit summary, imaging studies and labs were reviewed by me today

## 2022-12-29 NOTE — Assessment & Plan Note (Signed)
Smokes about 0.5 pack/day  Asked about quitting: confirms that he/she currently smokes cigarettes Advise to quit smoking: Educated about QUITTING to reduce the risk of cancer, cardio and cerebrovascular disease. Assess willingness: Unwilling to quit at this time, but is working on cutting back. Assist with counseling and pharmacotherapy: Counseled for 5 minutes  Arrange for follow up: follow up in 4 months and continue to offer help.

## 2022-12-29 NOTE — Assessment & Plan Note (Signed)
Of right foot now resolved.  No redness or drainage noted on examination of bilateral foot today, denies fever, chills, malaise.  Has completed full course of antibiotics ordered after  his hospital stay

## 2022-12-29 NOTE — Assessment & Plan Note (Signed)
Referral to dermatologist is pending Kenalog 0.1% cream apply 2 times daily, hydroxyzine 25 mg every 8 hours as needed for itching ordered.  Avoid scratching the rashes to prevent infection

## 2022-12-29 NOTE — Assessment & Plan Note (Signed)
Much improved compared to previous visit DASH diet advised patient encouraged to keep legs elevated when sitting to help decrease swelling

## 2022-12-29 NOTE — Assessment & Plan Note (Signed)
12/29/2022    1:33 PM 11/26/2022    1:16 PM  Depression screen PHQ 2/9  Decreased Interest 1 0  Down, Depressed, Hopeless 3 2  PHQ - 2 Score 4 2  Altered sleeping 3 3  Tired, decreased energy 2 2  Change in appetite 3 0  Feeling bad or failure about yourself  3 1  Trouble concentrating 0 0  Moving slowly or fidgety/restless 3 3  Suicidal thoughts 0 0  PHQ-9 Score 18 11  Difficult doing work/chores Very difficult Somewhat difficult  He was referred for counseling during his last visit but they were unable to contact him to schedule an appointment.  Appointment scheduled today He denies SI,HI

## 2022-12-30 LAB — BASIC METABOLIC PANEL
BUN/Creatinine Ratio: 16 (ref 9–20)
BUN: 14 mg/dL (ref 6–24)
CO2: 23 mmol/L (ref 20–29)
Calcium: 9 mg/dL (ref 8.7–10.2)
Chloride: 104 mmol/L (ref 96–106)
Creatinine, Ser: 0.89 mg/dL (ref 0.76–1.27)
Glucose: 127 mg/dL — ABNORMAL HIGH (ref 70–99)
Potassium: 4.2 mmol/L (ref 3.5–5.2)
Sodium: 141 mmol/L (ref 134–144)
eGFR: 103 mL/min/{1.73_m2} (ref >59)

## 2022-12-30 LAB — CBC
Hematocrit: 40.4 % (ref 37.5–51.0)
Hemoglobin: 12.3 g/dL — ABNORMAL LOW (ref 13.0–17.7)
MCH: 18.8 pg — ABNORMAL LOW (ref 26.6–33.0)
MCHC: 30.4 g/dL — ABNORMAL LOW (ref 31.5–35.7)
MCV: 62 fL — ABNORMAL LOW (ref 79–97)
Platelets: 317 10*3/uL (ref 150–450)
RBC: 6.53 x10E6/uL — ABNORMAL HIGH (ref 4.14–5.80)
RDW: 19.2 % — ABNORMAL HIGH (ref 11.6–15.4)
WBC: 7.7 10*3/uL (ref 3.4–10.8)

## 2022-12-30 NOTE — Progress Notes (Signed)
Stable labs, continue current medications and follow up as planned.

## 2022-12-31 NOTE — Progress Notes (Signed)
Left message for patient to return call to office at [phone].  

## 2023-01-13 ENCOUNTER — Institutional Professional Consult (permissible substitution): Payer: Medicaid Other | Admitting: Clinical

## 2023-01-16 ENCOUNTER — Other Ambulatory Visit: Payer: Self-pay

## 2023-01-16 ENCOUNTER — Inpatient Hospital Stay (HOSPITAL_COMMUNITY)
Admission: EM | Admit: 2023-01-16 | Discharge: 2023-01-28 | DRG: 872 | Disposition: A | Discharge status: Home / Self Care | Payer: Medicaid Other | Attending: Internal Medicine | Admitting: Internal Medicine

## 2023-01-16 ENCOUNTER — Emergency Department (HOSPITAL_COMMUNITY): Payer: Medicaid Other

## 2023-01-16 DIAGNOSIS — I739 Peripheral vascular disease, unspecified: Secondary | ICD-10-CM | POA: Diagnosis present

## 2023-01-16 DIAGNOSIS — Z79899 Other long term (current) drug therapy: Secondary | ICD-10-CM

## 2023-01-16 DIAGNOSIS — D649 Anemia, unspecified: Secondary | ICD-10-CM | POA: Diagnosis present

## 2023-01-16 DIAGNOSIS — L03115 Cellulitis of right lower limb: Secondary | ICD-10-CM | POA: Diagnosis present

## 2023-01-16 DIAGNOSIS — A419 Sepsis, unspecified organism: Principal | ICD-10-CM | POA: Diagnosis present

## 2023-01-16 DIAGNOSIS — Z8249 Family history of ischemic heart disease and other diseases of the circulatory system: Secondary | ICD-10-CM

## 2023-01-16 DIAGNOSIS — E860 Dehydration: Secondary | ICD-10-CM | POA: Diagnosis present

## 2023-01-16 DIAGNOSIS — F17210 Nicotine dependence, cigarettes, uncomplicated: Secondary | ICD-10-CM | POA: Diagnosis present

## 2023-01-16 DIAGNOSIS — R21 Rash and other nonspecific skin eruption: Secondary | ICD-10-CM

## 2023-01-16 DIAGNOSIS — F1990 Other psychoactive substance use, unspecified, uncomplicated: Secondary | ICD-10-CM

## 2023-01-16 DIAGNOSIS — Z720 Tobacco use: Secondary | ICD-10-CM | POA: Diagnosis present

## 2023-01-16 DIAGNOSIS — I10 Essential (primary) hypertension: Secondary | ICD-10-CM | POA: Diagnosis present

## 2023-01-16 DIAGNOSIS — N179 Acute kidney failure, unspecified: Secondary | ICD-10-CM | POA: Diagnosis present

## 2023-01-16 DIAGNOSIS — R6520 Severe sepsis without septic shock: Secondary | ICD-10-CM | POA: Diagnosis present

## 2023-01-16 NOTE — ED Triage Notes (Addendum)
Pt c/o increased right lower leg swelling, worsening leg pain, and new SOB. Was recently admitted for cellulitis right lower leg s/t nail injury. Edema and swelling to right foot into right lower leg +4. No new trauma to leg. States worsening pain that is not relieved by ibuprofen since discharge. Pt has not been able complaint with prescribed medication, including keflex and lasix. Endorses chills. Hx CHF.

## 2023-01-17 ENCOUNTER — Encounter (HOSPITAL_COMMUNITY): Payer: Self-pay | Admitting: Internal Medicine

## 2023-01-17 ENCOUNTER — Inpatient Hospital Stay (HOSPITAL_COMMUNITY): Payer: Medicaid Other

## 2023-01-17 DIAGNOSIS — L538 Other specified erythematous conditions: Secondary | ICD-10-CM | POA: Diagnosis not present

## 2023-01-17 DIAGNOSIS — Z79899 Other long term (current) drug therapy: Secondary | ICD-10-CM | POA: Diagnosis not present

## 2023-01-17 DIAGNOSIS — I10 Essential (primary) hypertension: Secondary | ICD-10-CM | POA: Diagnosis not present

## 2023-01-17 DIAGNOSIS — A419 Sepsis, unspecified organism: Secondary | ICD-10-CM

## 2023-01-17 DIAGNOSIS — L03115 Cellulitis of right lower limb: Secondary | ICD-10-CM | POA: Diagnosis present

## 2023-01-17 DIAGNOSIS — I739 Peripheral vascular disease, unspecified: Secondary | ICD-10-CM | POA: Diagnosis present

## 2023-01-17 DIAGNOSIS — N179 Acute kidney failure, unspecified: Secondary | ICD-10-CM | POA: Diagnosis not present

## 2023-01-17 DIAGNOSIS — E860 Dehydration: Secondary | ICD-10-CM | POA: Diagnosis present

## 2023-01-17 DIAGNOSIS — R52 Pain, unspecified: Secondary | ICD-10-CM | POA: Diagnosis not present

## 2023-01-17 DIAGNOSIS — R6520 Severe sepsis without septic shock: Secondary | ICD-10-CM

## 2023-01-17 DIAGNOSIS — F17210 Nicotine dependence, cigarettes, uncomplicated: Secondary | ICD-10-CM | POA: Diagnosis present

## 2023-01-17 DIAGNOSIS — M7989 Other specified soft tissue disorders: Secondary | ICD-10-CM | POA: Diagnosis not present

## 2023-01-17 DIAGNOSIS — Z720 Tobacco use: Secondary | ICD-10-CM

## 2023-01-17 DIAGNOSIS — D649 Anemia, unspecified: Secondary | ICD-10-CM | POA: Diagnosis not present

## 2023-01-17 DIAGNOSIS — Z8249 Family history of ischemic heart disease and other diseases of the circulatory system: Secondary | ICD-10-CM | POA: Diagnosis not present

## 2023-01-17 LAB — CBC WITH DIFFERENTIAL/PLATELET
Abs Immature Granulocytes: 1.07 10*3/uL — ABNORMAL HIGH (ref 0.00–0.07)
Abs Immature Granulocytes: 1.14 10*3/uL — ABNORMAL HIGH (ref 0.00–0.07)
Basophils Absolute: 0.2 10*3/uL — ABNORMAL HIGH (ref 0.0–0.1)
Basophils Absolute: 0 10*3/uL (ref 0.0–0.1)
Basophils Relative: 1 %
Basophils Relative: 0 %
Eosinophils Absolute: 0.1 10*3/uL (ref 0.0–0.5)
Eosinophils Absolute: 0.3 10*3/uL (ref 0.0–0.5)
Eosinophils Relative: 1 %
Eosinophils Relative: 1 %
HCT: 31.5 % — ABNORMAL LOW (ref 39.0–52.0)
HCT: 30.4 % — ABNORMAL LOW (ref 39.0–52.0)
Hemoglobin: 10.6 g/dL — ABNORMAL LOW (ref 13.0–17.0)
Hemoglobin: 10 g/dL — ABNORMAL LOW (ref 13.0–17.0)
Immature Granulocytes: 4 %
Immature Granulocytes: 4 %
Lymphocytes Relative: 3 %
Lymphocytes Relative: 3 %
Lymphs Abs: 0.8 10*3/uL (ref 0.7–4.0)
Lymphs Abs: 0.9 10*3/uL (ref 0.7–4.0)
MCH: 19.2 pg — ABNORMAL LOW (ref 26.0–34.0)
MCH: 19 pg — ABNORMAL LOW (ref 26.0–34.0)
MCHC: 33.7 g/dL (ref 30.0–36.0)
MCHC: 32.9 g/dL (ref 30.0–36.0)
MCV: 57.2 fL — ABNORMAL LOW (ref 80.0–100.0)
MCV: 57.7 fL — ABNORMAL LOW (ref 80.0–100.0)
Monocytes Absolute: 1.8 10*3/uL — ABNORMAL HIGH (ref 0.1–1.0)
Monocytes Absolute: 0.6 10*3/uL (ref 0.1–1.0)
Monocytes Relative: 7 %
Monocytes Relative: 2 %
Neutro Abs: 22.8 10*3/uL — ABNORMAL HIGH (ref 1.7–7.7)
Neutro Abs: 27.4 10*3/uL — ABNORMAL HIGH (ref 1.7–7.7)
Neutrophils Relative %: 84 %
Neutrophils Relative %: 94 %
Platelets: 224 10*3/uL (ref 150–400)
Platelets: 195 10*3/uL (ref 150–400)
RBC: 5.51 MIL/uL (ref 4.22–5.81)
RBC: 5.27 MIL/uL (ref 4.22–5.81)
RDW: 18.6 % — ABNORMAL HIGH (ref 11.5–15.5)
RDW: 18.4 % — ABNORMAL HIGH (ref 11.5–15.5)
WBC: 26.7 10*3/uL — ABNORMAL HIGH (ref 4.0–10.5)
WBC: 29.1 10*3/uL — ABNORMAL HIGH (ref 4.0–10.5)
nRBC: 0 % (ref 0.0–0.2)
nRBC: 0 % (ref 0.0–0.2)
nRBC: 0 /100 WBC

## 2023-01-17 LAB — TROPONIN I (HIGH SENSITIVITY)
Troponin I (High Sensitivity): 5 ng/L (ref <18)
Troponin I (High Sensitivity): 7 ng/L (ref <18)

## 2023-01-17 LAB — COMPREHENSIVE METABOLIC PANEL
ALT: 22 U/L (ref 0–44)
ALT: 23 U/L (ref 0–44)
AST: 23 U/L (ref 15–41)
AST: 23 U/L (ref 15–41)
Albumin: 2.4 g/dL — ABNORMAL LOW (ref 3.5–5.0)
Albumin: 2.6 g/dL — ABNORMAL LOW (ref 3.5–5.0)
Alkaline Phosphatase: 100 U/L (ref 38–126)
Alkaline Phosphatase: 87 U/L (ref 38–126)
Anion gap: 13 (ref 5–15)
Anion gap: 16 — ABNORMAL HIGH (ref 5–15)
BUN: 54 mg/dL — ABNORMAL HIGH (ref 6–20)
BUN: 57 mg/dL — ABNORMAL HIGH (ref 6–20)
CO2: 17 mmol/L — ABNORMAL LOW (ref 22–32)
CO2: 20 mmol/L — ABNORMAL LOW (ref 22–32)
Calcium: 8.1 mg/dL — ABNORMAL LOW (ref 8.9–10.3)
Calcium: 8.6 mg/dL — ABNORMAL LOW (ref 8.9–10.3)
Chloride: 101 mmol/L (ref 98–111)
Chloride: 103 mmol/L (ref 98–111)
Creatinine, Ser: 4.96 mg/dL — ABNORMAL HIGH (ref 0.61–1.24)
Creatinine, Ser: 5.15 mg/dL — ABNORMAL HIGH (ref 0.61–1.24)
GFR, Estimated: 13 mL/min — ABNORMAL LOW (ref >60)
GFR, Estimated: 13 mL/min — ABNORMAL LOW (ref >60)
Glucose, Bld: 107 mg/dL — ABNORMAL HIGH (ref 70–99)
Glucose, Bld: 137 mg/dL — ABNORMAL HIGH (ref 70–99)
Potassium: 3.7 mmol/L (ref 3.5–5.1)
Potassium: 3.8 mmol/L (ref 3.5–5.1)
Sodium: 134 mmol/L — ABNORMAL LOW (ref 135–145)
Sodium: 136 mmol/L (ref 135–145)
Total Bilirubin: 0.7 mg/dL (ref 0.3–1.2)
Total Bilirubin: 1.2 mg/dL (ref 0.3–1.2)
Total Protein: 6.4 g/dL — ABNORMAL LOW (ref 6.5–8.1)
Total Protein: 7 g/dL (ref 6.5–8.1)

## 2023-01-17 LAB — URINALYSIS, COMPLETE (UACMP) WITH MICROSCOPIC
Bilirubin Urine: NEGATIVE
Glucose, UA: NEGATIVE mg/dL
Ketones, ur: NEGATIVE mg/dL
Leukocytes,Ua: NEGATIVE
Nitrite: NEGATIVE
Protein, ur: 30 mg/dL — AB
Specific Gravity, Urine: 1.008 (ref 1.005–1.030)
pH: 5 (ref 5.0–8.0)

## 2023-01-17 LAB — RETICULOCYTES
Immature Retic Fract: 9.9 % (ref 2.3–15.9)
RBC.: 5.17 MIL/uL (ref 4.22–5.81)
Retic Count, Absolute: 29 10*3/uL (ref 19.0–186.0)
Retic Ct Pct: 0.6 % (ref 0.4–3.1)

## 2023-01-17 LAB — PROTIME-INR
INR: 1.4 — ABNORMAL HIGH (ref 0.8–1.2)
Prothrombin Time: 17.1 seconds — ABNORMAL HIGH (ref 11.4–15.2)

## 2023-01-17 LAB — IRON AND TIBC
Iron: 23 ug/dL — ABNORMAL LOW (ref 45–182)
Saturation Ratios: 13 % — ABNORMAL LOW (ref 17.9–39.5)
TIBC: 179 ug/dL — ABNORMAL LOW (ref 250–450)
UIBC: 156 ug/dL

## 2023-01-17 LAB — MAGNESIUM
Magnesium: 2.3 mg/dL (ref 1.7–2.4)
Magnesium: 2.3 mg/dL (ref 1.7–2.4)

## 2023-01-17 LAB — LACTIC ACID, PLASMA
Lactic Acid, Venous: 1 mmol/L (ref 0.5–1.9)
Lactic Acid, Venous: 1.2 mmol/L (ref 0.5–1.9)

## 2023-01-17 LAB — SALICYLATE LEVEL: Salicylate Lvl: <7 mg/dL — ABNORMAL LOW (ref 7.0–30.0)

## 2023-01-17 LAB — FERRITIN: Ferritin: 1051 ng/mL — ABNORMAL HIGH (ref 24–336)

## 2023-01-17 LAB — CREATININE, URINE, RANDOM: Creatinine, Urine: 49 mg/dL

## 2023-01-17 LAB — ACETAMINOPHEN LEVEL: Acetaminophen (Tylenol), Serum: <10 ug/mL — ABNORMAL LOW (ref 10–30)

## 2023-01-17 LAB — BRAIN NATRIURETIC PEPTIDE: B Natriuretic Peptide: 117.5 pg/mL — ABNORMAL HIGH (ref 0.0–100.0)

## 2023-01-17 LAB — SODIUM, URINE, RANDOM: Sodium, Ur: 73 mmol/L

## 2023-01-17 MED ORDER — HYDROMORPHONE HCL 1 MG/ML IJ SOLN
0.5000 mg | INTRAMUSCULAR | Status: DC | PRN
  Administered 2023-01-17 – 2023-01-19 (×5): 0.5 mg via INTRAVENOUS
  Filled 2023-01-17 (×4): qty 0.5
  Filled 2023-01-17: qty 1

## 2023-01-17 MED ORDER — SODIUM CHLORIDE 0.9 % IV SOLN
2.0000 g | INTRAVENOUS | Status: DC
  Administered 2023-01-17 – 2023-01-18 (×2): 2 g via INTRAVENOUS
  Filled 2023-01-17 (×2): qty 12.5

## 2023-01-17 MED ORDER — SODIUM CHLORIDE 0.9 % IV BOLUS
1500.0000 mL | Freq: Once | INTRAVENOUS | Status: AC
  Administered 2023-01-17: 1500 mL via INTRAVENOUS

## 2023-01-17 MED ORDER — NALOXONE HCL 0.4 MG/ML IJ SOLN: 0.4000 mg | INTRAMUSCULAR | Status: DC | PRN

## 2023-01-17 MED ORDER — MELATONIN 3 MG PO TABS: 3.0000 mg | ORAL_TABLET | Freq: Every evening | ORAL | Status: DC | PRN

## 2023-01-17 MED ORDER — FENTANYL CITRATE PF 50 MCG/ML IJ SOSY
100.0000 ug | PREFILLED_SYRINGE | Freq: Once | INTRAMUSCULAR | Status: AC
  Administered 2023-01-17: 100 ug via INTRAVENOUS
  Filled 2023-01-17: qty 2

## 2023-01-17 MED ORDER — NICOTINE 14 MG/24HR TD PT24: 14.0000 mg | MEDICATED_PATCH | Freq: Every day | TRANSDERMAL | Status: DC | PRN

## 2023-01-17 MED ORDER — SODIUM CHLORIDE 0.9 % IV SOLN
2.0000 g | Freq: Once | INTRAVENOUS | Status: AC
  Administered 2023-01-17: 2 g via INTRAVENOUS
  Filled 2023-01-17: qty 12.5

## 2023-01-17 MED ORDER — VANCOMYCIN VARIABLE DOSE PER UNSTABLE RENAL FUNCTION (PHARMACIST DOSING): Status: DC

## 2023-01-17 MED ORDER — LACTATED RINGERS IV SOLN: INTRAVENOUS | Status: DC

## 2023-01-17 MED ORDER — HEPARIN SODIUM (PORCINE) 5000 UNIT/ML IJ SOLN
5000.0000 [IU] | Freq: Three times a day (TID) | INTRAMUSCULAR | Status: DC
  Administered 2023-01-17 – 2023-01-28 (×32): 5000 [IU] via SUBCUTANEOUS
  Filled 2023-01-17 (×32): qty 1

## 2023-01-17 MED ORDER — ACETAMINOPHEN 325 MG PO TABS
650.0000 mg | ORAL_TABLET | Freq: Four times a day (QID) | ORAL | Status: DC | PRN
  Administered 2023-01-17: 650 mg via ORAL
  Filled 2023-01-17 (×3): qty 2

## 2023-01-17 MED ORDER — INFLUENZA VAC SPLIT QUAD 0.5 ML IM SUSY
0.5000 mL | PREFILLED_SYRINGE | INTRAMUSCULAR | Status: DC
  Filled 2023-01-17: qty 0.5

## 2023-01-17 MED ORDER — VANCOMYCIN HCL 1500 MG/300ML IV SOLN
1500.0000 mg | Freq: Once | INTRAVENOUS | Status: AC
  Administered 2023-01-17: 1500 mg via INTRAVENOUS
  Filled 2023-01-17: qty 300

## 2023-01-17 MED ORDER — ONDANSETRON HCL 4 MG/2ML IJ SOLN: 4.0000 mg | Freq: Four times a day (QID) | INTRAMUSCULAR | Status: DC | PRN

## 2023-01-17 MED ORDER — AMLODIPINE BESYLATE 5 MG PO TABS
5.0000 mg | ORAL_TABLET | Freq: Every day | ORAL | Status: DC
  Administered 2023-01-17 – 2023-01-27 (×11): 5 mg via ORAL
  Filled 2023-01-17 (×11): qty 1

## 2023-01-17 MED ORDER — LINEZOLID 600 MG/300ML IV SOLN
600.0000 mg | Freq: Two times a day (BID) | INTRAVENOUS | Status: AC
  Administered 2023-01-17 – 2023-01-23 (×13): 600 mg via INTRAVENOUS
  Filled 2023-01-17 (×13): qty 300

## 2023-01-17 MED ORDER — ACETAMINOPHEN 650 MG RE SUPP: 650.0000 mg | Freq: Four times a day (QID) | RECTAL | Status: DC | PRN

## 2023-01-17 MED ORDER — SODIUM CHLORIDE 0.9 % IV BOLUS
1000.0000 mL | Freq: Once | INTRAVENOUS | Status: AC
  Administered 2023-01-17: 1000 mL via INTRAVENOUS

## 2023-01-17 NOTE — ED Notes (Signed)
RN called to request receiving RN information for transport.

## 2023-01-17 NOTE — Consult Note (Addendum)
Renal Service Consult Note [facility]  Timothy Mcdowell 01/17/2023 Timothy Blazing, MD Requesting Physician: Dr. Lupita Mcdowell  Reason for Consult: Renal failure HPI: The patient is a 52 y.o. year-old w/ PMH as below who presented w/ worsening lower leg swelling and pain/ erythema. Was recently admitted after injury to the R lower leg from a nail injury. Has been taking 634m advil 3 times per day. He was recently started on lisinopril and norvasc for HTN as well. In ED creatinine was up at 5.1 (b/l creatinine from 12/29/22 was 0.89). We are asked to see for renal failure.   Pt seen in room. Has been taking "a lot " of advil during the last month or so. Bilat feet and lower legs are very inflamed and painful from time to time. States his is voiding w/o difficulty, urine is clear and light yellow. No n/v/d. No hx kidney damage.     ROS - denies CP, no joint pain, no HA, no blurry vision, no rash, no diarrhea, no nausea/ vomiting, no dysuria, no difficulty voiding   Past Medical History  Past Medical History:  Diagnosis Date   Hypertension    Past Surgical History History reviewed. No pertinent surgical history. Family History  Family History  Problem Relation Age of Onset   Hypertension Mother    Social History  reports that he has been smoking. He has never used smokeless tobacco. He reports current drug use. Drug: Marijuana. He reports that he does not drink alcohol. Allergies  Allergies  Allergen Reactions   Other Rash    strawberries   Home medications Prior to Admission medications   Medication Sig Start Date End Date Taking? Authorizing Provider  acetaminophen (TYLENOL) 500 MG tablet Take 500 mg by mouth every 6 (six) hours as needed for moderate pain.   Yes [provider]  amLODipine (NORVASC) 5 MG tablet Take 1 tablet (5 mg total) by mouth daily. 12/29/22  Yes Timothy Mcdowell  hydrOXYzine (VISTARIL) 25 MG capsule Take 1 capsule (25 mg total) by  mouth every 8 (eight) hours as needed for itching. 12/29/22  Yes Timothy Mcdowell  ibuprofen (ADVIL) 600 MG tablet Take 1 tablet (600 mg total) by mouth every 8 (eight) hours as needed. 12/29/22  Yes Timothy Mcdowell  lisinopril (ZESTRIL) 20 MG tablet Take 1 tablet (20 mg total) by mouth daily. 12/29/22  Yes Timothy Mcdowell  triamcinolone cream (KENALOG) 0.1 % Apply 1 Application topically 2 (two) times daily. 12/29/22  Yes Timothy Mcdowell     Vitals:   01/17/23 [id number]/10/24 0820 01/17/23 0934 01/17/23 1338  BP:  135/71 118/64 119/64  Pulse:  (!) 109 (!) 110 (!) 110  Resp:  18 15 20  $ Temp:  98.7 F (37.1 C) 98.5 F (36.9 C) 98.2 F (36.8 C)  TempSrc:  Oral Oral Oral  SpO2:  99% 99% 98%  Weight: 81.4 kg     Height:       Exam Gen alert, no distress No rash, cyanosis or gangrene Sclera anicteric, throat clear  Flat neck veins, large carotid pulsation in the neck Chest clear bilat to bases, no rales/ wheezing RRR no MRG Abd soft ntnd no mass or ascites +bs GU normal male MS no joint effusions or deformity Ext bilat lower legs w/ sig discoloration, blistering and erythema Also bilat lower leg 1-2+ edema but w/ sig wrinkling at the same time Neuro is alert, Ox 3 , nf  Home meds include - nlrvasc, advil, lisinopril 20 qd, prns     Na 136  K 3.7  CO2 17  AG 16  BUN 54  creat 4.96      UA 2/10 - rare bact, 30 prot, 0-5 rbc/ wbc      UNa 73, UCr 49        Renal US - 10.2 and 10.5 cm kidneys, no hydro, echo wnl  Assessment/ Plan: AKI - b/l creat was normal at 0.89 on Dec 29, 2022. Creat here 5.15 yesterday on admission in setting of worsening acute/ chronic bilat foot / lower leg wounds/ cellulitis. UA negative and renal US unremarkable. BP's wnl, prob is intravasc volume depleted w/ focal bilat pretib/ pedal edema which is likely secondary to infection/ inflammation and not vol overload. Got 1 L in ED and LR at 100 cc/hr. Good UOP. Will rebolus  1500 cc and ^LR to 125 cc/hr. Cont to hold acei's and nsaids for now. Will follow.  Sepsis / bilat LE cellulitis - getting IV cefepime and possibly also IV vanc HTN - BP's wnl, home lisinopril dc'd. Cont to avoid acei/ arb for this patient.       Timothy Splinter  MD [facility] 01/17/2023, 1:39 PM  Recent Labs  Lab 01/16/23 2345 01/17/23 0422  HGB 10.6* 10.0*  ALBUMIN 2.6* 2.4*  CALCIUM 8.6* 8.1*  CREATININE 5.15* 4.96*  K 3.8 3.7   Inpatient medications:  amLODipine  5 mg Oral Daily   heparin  5,000 Units Subcutaneous Q8H   [START ON 01/18/2023] influenza vac split quadrivalent PF  0.5 mL Intramuscular Tomorrow-1000   vancomycin variable dose per unstable renal function (pharmacist dosing)   Does not apply See admin instructions    ceFEPime (MAXIPIME) IV     lactated ringers 100 mL/hr at 01/17/23 0815   acetaminophen **OR** acetaminophen, HYDROmorphone (DILAUDID) injection, melatonin, naLOXone (NARCAN)  injection, nicotine, ondansetron (ZOFRAN) IV

## 2023-01-17 NOTE — ED Notes (Signed)
ED TO INPATIENT HANDOFF REPORT  ED Nurse Name and Phone #:  Lorenda Ishihara [phone]  S Name/Age/Gender Timothy Mcdowell 52 y.o. male Room/Bed: 020C/020C  Code Status   Code Status: Full Code  Home/SNF/Other Home Patient oriented to: self, place, time, and situation Is this baseline? Yes   Triage Complete: Triage complete  Chief Complaint Cellulitis of right foot [id number]  Triage Note Pt c/o increased right lower leg swelling, worsening leg pain, and new SOB. Was recently admitted for cellulitis right lower leg s/t nail injury. Edema and swelling to right foot into right lower leg +4. No new trauma to leg. States worsening pain that is not relieved by ibuprofen since discharge. Pt has not been able complaint with prescribed medication, including keflex and lasix. Endorses chills. Hx CHF.    Allergies Allergies  Allergen Reactions   Other Rash    strawberries    Level of Care/Admitting Diagnosis ED Disposition     ED Disposition  Admit   Condition  --   Comment  Hospital Area: [facility] [[id number]]  Level of Care: Telemetry Medical [104]  May admit patient to [facility] or [facility] if equivalent level of care is available:: No  Covid Evaluation: Asymptomatic - no recent exposure (last 10 days) testing not required  Diagnosis: Cellulitis of right foot [id number]  Admitting Physician: Rhetta Mura [id number]  Attending Physician: Rhetta Mura [id number]  Certification:: I certify this patient will need inpatient services for at least 2 midnights  Estimated Length of Stay: 2          B Medical/Surgery History Past Medical History:  Diagnosis Date   Hypertension    No past surgical history on file.   A IV Location/Drains/Wounds Patient Lines/Drains/Airways Status     Active Line/Drains/Airways     Name Placement date Placement time Site Days   Peripheral IV 01/17/23 20 G Anterior;Proximal;Right Forearm 01/17/23  0128  Forearm  less than  1   Peripheral IV 01/17/23 20 G Anterior;Left;Proximal Forearm 01/17/23  0128  Forearm  less than 1            Intake/Output Last 24 hours No intake or output data in the 24 hours ending 01/17/23 0135  Labs/Imaging Results for orders placed or performed during the hospital encounter of 01/16/23 (from the past 48 hour(s))  Lactic acid, plasma     Status: None   Collection Time: 01/16/23 11:45 PM  Result Value Ref Range   Lactic Acid, Venous 1.2 0.5 - 1.9 mmol/L    Comment: Performed at [facility], [address], [place], [place] [address]  Comprehensive metabolic panel     Status: Abnormal   Collection Time: 01/16/23 11:45 PM  Result Value Ref Range   Sodium 134 (L) 135 - 145 mmol/L   Potassium 3.8 3.5 - 5.1 mmol/L   Chloride 101 98 - 111 mmol/L   CO2 20 (L) 22 - 32 mmol/L   Glucose, Bld 137 (H) 70 - 99 mg/dL    Comment: Glucose reference range applies only to samples taken after fasting for at least 8 hours.   BUN 57 (H) 6 - 20 mg/dL   Creatinine, Ser 5.15 (H) 0.61 - 1.24 mg/dL   Calcium 8.6 (L) 8.9 - 10.3 mg/dL   Total Protein 7.0 6.5 - 8.1 g/dL   Albumin 2.6 (L) 3.5 - 5.0 g/dL   AST 23 15 - 41 U/L   ALT 23 0 - 44 U/L  Alkaline Phosphatase 87 38 - 126 U/L   Total Bilirubin 0.7 0.3 - 1.2 mg/dL   GFR, Estimated 13 (L) >60 mL/min    Comment: (NOTE) Calculated using the CKD-EPI Creatinine Equation (2021)    Anion gap 13 5 - 15    Comment: Performed at [facility] [address] [place], [place] [address]  CBC with Differential     Status: Abnormal   Collection Time: 01/16/23 11:45 PM  Result Value Ref Range   WBC 26.7 (H) 4.0 - 10.5 K/uL   RBC 5.51 4.22 - 5.81 MIL/uL   Hemoglobin 10.6 (L) 13.0 - 17.0 g/dL   HCT 31.5 (L) 39.0 - 52.0 %   MCV 57.2 (L) 80.0 - 100.0 fL   MCH 19.2 (L) 26.0 - 34.0 pg   MCHC 33.7 30.0 - 36.0 g/dL   RDW 18.6 (H) 11.5 - 15.5 %   Platelets 224 150 - 400 K/uL    Comment: REPEATED TO VERIFY   nRBC 0.0 0.0 - 0.2 %    Neutrophils Relative % 84 %   Neutro Abs 22.8 (H) 1.7 - 7.7 K/uL   Lymphocytes Relative 3 %   Lymphs Abs 0.8 0.7 - 4.0 K/uL   Monocytes Relative 7 %   Monocytes Absolute 1.8 (H) 0.1 - 1.0 K/uL   Eosinophils Relative 1 %   Eosinophils Absolute 0.1 0.0 - 0.5 K/uL   Basophils Relative 1 %   Basophils Absolute 0.2 (H) 0.0 - 0.1 K/uL   WBC Morphology MILD LEFT SHIFT (1-5% METAS, OCC MYELO, OCC BANDS)    RBC Morphology Acanthocytes present    Immature Granulocytes 4 %   Abs Immature Granulocytes 1.07 (H) 0.00 - 0.07 K/uL    Comment: Performed at [facility], [place] [address] [place], [place] [address]  Troponin I (High Sensitivity)     Status: None   Collection Time: 01/16/23 11:45 PM  Result Value Ref Range   Troponin I (High Sensitivity) 5 <18 ng/L    Comment: (NOTE) Elevated high sensitivity troponin I (hsTnI) values and significant  changes across serial measurements may suggest ACS but many other  chronic and acute conditions are known to elevate hsTnI results.  Refer to the "Links" section for chest pain algorithms and additional  guidance. Performed at [facility], [place] [address] [place], [place] [address]    DG Foot Complete Right  Result Date: 01/17/2023 CLINICAL DATA:  Penetrating injury with a nail. Increasing swelling. EXAM: RIGHT FOOT COMPLETE - 3+ VIEW COMPARISON:  Study of 12/12/2022 FINDINGS: There is artifact from an overlying sock. Severe edema continues to be seen, greatest superiorly. A small accessory os supranaviculare is again noted on the lateral view and an os trigonum posteriorly. There is mild hallux valgus with otherwise normal interosseous alignment. Mild features of midfoot arthrosis. No foreign body is seen. There is no visible soft tissue gas. Left orbital fracture, dislocation or destructive bone lesions. IMPRESSION: 1. Severe soft tissue edema without evidence of soft tissue gas or appreciable foreign body. 2. No acute osseous findings.  Electronically Signed   By: Telford Nab M.D.   On: 01/17/2023 00:23   DG Chest 2 View  Result Date: 01/17/2023 CLINICAL DATA:  Shortness of breath. EXAM: CHEST - 2 VIEW COMPARISON:  Chest radiograph dated 06/03/2022. FINDINGS: No focal consolidation, pleural effusion, pneumothorax. Top-normal cardiac size. No acute osseous pathology. IMPRESSION: No active cardiopulmonary disease. Electronically Signed   By: Laren Everts.D.  On: 01/17/2023 00:16    Pending Labs Unresulted Labs (From admission, onward)     Start     Ordered   01/17/23 0500  CBC with Differential/Platelet  Tomorrow morning,   R        01/17/23 0133   01/17/23 0500  Comprehensive metabolic panel  Tomorrow morning,   R        01/17/23 0133   01/17/23 0500  Magnesium  Tomorrow morning,   R        01/17/23 0133   01/17/23 0134  Magnesium  Add-on,   AD        01/17/23 0133   [id number]  Salicylate level  Once,   STAT        01/17/23 0055   01/17/23 0056  Acetaminophen level  Once,   STAT        01/17/23 0055   01/17/23 0055  Blood culture (routine x 2)  BLOOD CULTURE X 2,   R      01/17/23 0055   01/16/23 2346  Brain natriuretic peptide  Once,   URGENT        01/16/23 2345   01/16/23 2338  Lactic acid, plasma  Now then every 2 hours,   R      01/16/23 2337            Vitals/Pain Today's Vitals   01/16/23 2328 01/16/23 2334  BP: (!) 148/68   Pulse: (!) 103   Resp: 20   Temp: (!) 97.3 F (36.3 C)   TempSrc: Oral   SpO2: 94%   Weight:  79.4 kg  Height:  5' 10"$  (1.778 m)    Isolation Precautions No active isolations  Medications Medications  fentaNYL (SUBLIMAZE) injection 100 mcg (has no administration in time range)  sodium chloride 0.9 % bolus 1,000 mL (has no administration in time range)  vancomycin (VANCOREADY) IVPB 1500 mg/300 mL (has no administration in time range)  ceFEPIme (MAXIPIME) 2 g in sodium chloride 0.9 % 100 mL IVPB (has no administration in time range)  acetaminophen  (TYLENOL) tablet 650 mg (has no administration in time range)    Or  acetaminophen (TYLENOL) suppository 650 mg (has no administration in time range)  heparin injection 5,000 Units (has no administration in time range)  melatonin tablet 3 mg (has no administration in time range)  naloxone (NARCAN) injection 0.4 mg (has no administration in time range)  HYDROmorphone (DILAUDID) injection 0.5 mg (has no administration in time range)  ondansetron (ZOFRAN) injection 4 mg (has no administration in time range)    Mobility walks with person assist     Focused Assessments Bilateral leg swelling +3 pitting edema, right leg pain and discoloration.   R Recommendations: See Admitting Provider Note  Report given to:   Additional Notes:

## 2023-01-17 NOTE — Progress Notes (Signed)
Patient seen and examined personally, I reviewed the chart, history and physical and admission note, done by admitting physician this morning and agree with the same with following addendum.  Please refer to the morning admission note for more detailed plan of care.  He c/o pain in his feet endorses taking ibuprofen regularly along w/ lisinopril  Briefly, 52 y.o. male with medical history significant for essential hypertension, chronic tobacco abuse presented with worsening swelling and pain on his right lower leg, was recently admitted in January for same and was treated with IV antibiotics and discharged home on oral antibiotics but for the past 2 weeks patient noticing worsening of the infection.  He was initially not taking ibuprofen or Keflex as he did not have any money however has since picked up the medication as has been taking for past several days without much relief. In the ED vital signs stable BP tachycardia afebrile labs with leukocytosis metabolic acidosis AKI. EKG sinus rhythm nonspecific T wave changes, chest x-ray no evidence of acute cardiopulmonary process, patient was admitted for further evaluation of sepsis with secondary cellulitis and AKI and anemia.      Overnight afebrile mild tachycardic in low 100, BP fairly stable. Labs with creatinine 4.9 bicarb 17 BNP 117 LFTs with low albumin 2.4, labs with worsening leukocytosis 29, microcytic anemia chronic 10 g  A/P Right lower extremity cellulitis with associated severe sepsis POA Recent admission for right lower extremity first week of January: His MRI from 1/6 right foot showed cellulitis, no evidence of septic arthritis or osteomyelitis.  X-ray foot in the ED showed severe soft tissue edema without soft tissue gas or foreign body or osseous changes.  Continue vancomycin and cefepime pharmacy to dose  [id number] creatinine 0.8 on 1/22 on admission 5.5 multifactorial in the setting of dehydration lisinopril ibuprofen use and  also with infection.  Slowly improving,monitor urine output,avoid nephrotoxic meds. Cont ivf,checked renal US and is normal. Consulted nephrology input appreciated Recent Labs    06/03/22 1009 11/19/22 0101 12/12/22 1834 12/13/22 0816 12/14/22 0205 12/29/22 1412 01/16/23 2345 01/17/23 0422  BUN 16 21* 13 10 15 14 $ 57* 54*  CREATININE 0.95 0.98 1.09 1.10 1.02 0.89 5.15* [id number]*    Metabolic acidosis: In the setting of AKI, monitor, continue hydration  Anemia microcytic: Baseline hemoglobin around ~ 12gm,some drop,monitor. Recent Labs  Lab 01/16/23 2345 01/17/23 0422  HGB 10.6* 10.0*  HCT 31.5* 30.4*    Essential hypertension: Controlled, on amlodipine Chronic tobacco abuse: counseled

## 2023-01-17 NOTE — Progress Notes (Signed)
Pharmacy Antibiotic Note  Siddarth Yerby is a 52 y.o. male admitted on 01/16/2023 with AKI and RLE cellulitis.  Pharmacy has been consulted for Vancomycin and Cefepime dosing.  Vancomycin 1500 mg IV given in ED at  [facility]: F/U renal function and redose Vancomycin as indicated Cefepime 2 g IV q24h  Height: 5' 10"$  (177.8 cm) Weight: 79.4 kg (175 lb) IBW/kg (Calculated) : 73  Temp (24hrs), Avg:97.5 F (36.4 C), Min:97.3 F (36.3 C), Max:97.7 F (36.5 C)  Recent Labs  Lab 01/16/23 2345 01/17/23 0124  WBC 26.7*  --   CREATININE 5.15*  --   LATICACIDVEN 1.2 1.0    Estimated Creatinine Clearance: 17.3 mL/min (A) (by C-G formula based on SCr of 5.15 mg/dL (H)).    Allergies  Allergen Reactions   Other Rash    strawberries     Caryl Pina 01/17/2023 2:43 AM

## 2023-01-17 NOTE — Hospital Course (Addendum)
52 y.o. male with medical history significant for essential hypertension, chronic tobacco abuse presented with worsening swelling and pain on his right lower leg, was recently admitted in January for same and was treated with IV antibiotics and discharged home on oral antibiotics but for the past 2 weeks patient noticing worsening of the infection.  He was initially not taking ibuprofen or Keflex as he did not have any money however has since picked up the medication as has been taking for past several days without much relief. In the ED vital signs stable BP tachycardia afebrile labs with leukocytosis metabolic acidosis AKI. EKG sinus rhythm nonspecific T wave changes, chest x-ray no evidence of acute cardiopulmonary process, patient was admitted for further evaluation of sepsis with secondary cellulitis and AKI and anemia. His significant AKI nicely improved with IV fluids and minimizing nephrotoxic medications. Dr. Sharol Given has seen the patient for his foot infection

## 2023-01-17 NOTE — ED Provider Notes (Signed)
[facility] Provider Note   CSN: [id number] Arrival date & time: 01/16/23  2321     History  Chief Complaint  Patient presents with   Shortness of Breath   Leg Swelling    L    Timothy Mcdowell is a 52 y.o. male.  The history is provided by the patient and medical records.  Shortness of Breath  52 year old male with history of hypertension, presenting to the ED with worsening swelling and pain in his right lower leg.  States he was admitted in January for same, got significantly better on IV antibiotics and was discharged home with oral antibiotics.  States over the past 2 weeks he has noticed continuous decline.  Also reports some new shortness of breath.  He denies any chest pain.  He initially was not taking the ibuprofen or Keflex as he did not have any money, however has since picked up the medication has been taking for the past several days without much relief.  He openly admits to taking excessive ibuprofen as his pain is uncontrolled, often times taking 3 or more tablets at a time as opposed to 1.  He denies taking any other OTC meds such as tylenol or aspirin.  Home Medications Prior to Admission medications   Medication Sig Start Date End Date Taking? Authorizing Provider  acetaminophen (TYLENOL) 500 MG tablet Take 500 mg by mouth every 6 (six) hours as needed.    [provider]  amLODipine (NORVASC) 5 MG tablet Take 1 tablet (5 mg total) by mouth daily. 12/29/22   Renee Rival, FNP  hydrOXYzine (VISTARIL) 25 MG capsule Take 1 capsule (25 mg total) by mouth every 8 (eight) hours as needed for itching. 12/29/22   Paseda, Dewaine Conger, FNP  ibuprofen (ADVIL) 600 MG tablet Take 1 tablet (600 mg total) by mouth every 8 (eight) hours as needed. 12/29/22   Pased[employer], Dewaine Conger, FNP  lisinopril (ZESTRIL) 20 MG tablet Take 1 tablet (20 mg total) by mouth daily. 12/29/22   Renee Rival, FNP  triamcinolone cream (KENALOG)  0.1 % Apply 1 Application topically 2 (two) times daily. 12/29/22   Renee Rival, FNP      Allergies    Other    Review of Systems   Review of Systems  Respiratory:  Positive for shortness of breath.   Cardiovascular:  Positive for leg swelling.  All other systems reviewed and are negative.   Physical Exam Updated Vital Signs BP (!) 148/68   Pulse (!) 103   Temp (!) 97.3 F (36.3 C) (Oral)   Resp 20   Ht 5' 10"$  (1.778 m)   Wt 79.4 kg   SpO2 94%   BMI 25.11 kg/m   Physical Exam Vitals and nursing note reviewed.  Constitutional:      Appearance: He is well-developed.  HENT:     Head: Normocephalic and atraumatic.  Eyes:     Conjunctiva/sclera: Conjunctivae normal.     Pupils: Pupils are equal, round, and reactive to light.  Cardiovascular:     Rate and Rhythm: Normal rate and regular rhythm.     Heart sounds: Normal heart sounds.  Pulmonary:     Effort: Pulmonary effort is normal.     Breath sounds: Normal breath sounds. No wheezing or rhonchi.  Abdominal:     General: Bowel sounds are normal.     Palpations: Abdomen is soft.  Musculoskeletal:        General:  Normal range of motion.     Cervical back: Normal range of motion.     Comments: Right lower extremity has dermatitis appearing rash with intermixed erythema, shiny appearance, and warmth to touch, does appear to have some fungal infection and skin breakdown in between the toes, DP pulse intact, foot is very tender to touch diffusely  Skin:    General: Skin is warm and dry.  Neurological:     Mental Status: He is alert and oriented to person, place, and time.     ED Results / Procedures / Treatments   Labs (all labs ordered are listed, but only abnormal results are displayed) Labs Reviewed  COMPREHENSIVE METABOLIC PANEL - Abnormal; Notable for the following components:      Result Value   Sodium 134 (*)    CO2 20 (*)    Glucose, Bld 137 (*)    BUN 57 (*)    Creatinine, Ser 5.15 (*)    Calcium  8.6 (*)    Albumin 2.6 (*)    GFR, Estimated 13 (*)    All other components within normal limits  CBC WITH DIFFERENTIAL/PLATELET - Abnormal; Notable for the following components:   WBC 26.7 (*)    Hemoglobin 10.6 (*)    HCT 31.5 (*)    MCV 57.2 (*)    MCH 19.2 (*)    RDW 18.6 (*)    Neutro Abs 22.8 (*)    Monocytes Absolute 1.8 (*)    Basophils Absolute 0.2 (*)    Abs Immature Granulocytes 1.07 (*)    All other components within normal limits  CULTURE, BLOOD (ROUTINE X 2)  CULTURE, BLOOD (ROUTINE X 2)  LACTIC ACID, PLASMA  LACTIC ACID, PLASMA  BRAIN NATRIURETIC PEPTIDE  SALICYLATE LEVEL  ACETAMINOPHEN LEVEL  TROPONIN I (HIGH SENSITIVITY)  TROPONIN I (HIGH SENSITIVITY)    EKG EKG Interpretation  Date/Time:  Friday January 16 2023 23:28:09 EST Ventricular Rate:  100 PR Interval:  132 QRS Duration: 94 QT Interval:  338 QTC Calculation: 436 R Axis:   83 Text Interpretation: Normal sinus rhythm Normal ECG When compared with ECG of 03-Jun-2022 09:56, PREVIOUS ECG IS PRESENT Confirmed by Gerlene Fee [phone]) on 01/16/2023 11:51:07 PM  Radiology DG Foot Complete Right  Result Date: 01/17/2023 CLINICAL DATA:  Penetrating injury with a nail. Increasing swelling. EXAM: RIGHT FOOT COMPLETE - 3+ VIEW COMPARISON:  Study of 12/12/2022 FINDINGS: There is artifact from an overlying sock. Severe edema continues to be seen, greatest superiorly. A small accessory os supranaviculare is again noted on the lateral view and an os trigonum posteriorly. There is mild hallux valgus with otherwise normal interosseous alignment. Mild features of midfoot arthrosis. No foreign body is seen. There is no visible soft tissue gas. Left orbital fracture, dislocation or destructive bone lesions. IMPRESSION: 1. Severe soft tissue edema without evidence of soft tissue gas or appreciable foreign body. 2. No acute osseous findings. Electronically Signed   By: Telford Nab M.D.   On: 01/17/2023 00:23   DG Chest  2 View  Result Date: 01/17/2023 CLINICAL DATA:  Shortness of breath. EXAM: CHEST - 2 VIEW COMPARISON:  Chest radiograph dated 06/03/2022. FINDINGS: No focal consolidation, pleural effusion, pneumothorax. Top-normal cardiac size. No acute osseous pathology. IMPRESSION: No active cardiopulmonary disease. Electronically Signed   By: Anner Crete M.D.   On: 01/17/2023 00:16    Procedures Procedures    CRITICAL CARE Performed by: Larene Pickett   Total critical care time: 45 minutes  Critical care time was exclusive of separately billable procedures and treating other patients.  Critical care was necessary to treat or prevent imminent or life-threatening deterioration.  Critical care was time spent personally by me on the following activities: development of treatment plan with patient and/or surrogate as well as nursing, discussions with consultants, evaluation of patient's response to treatment, examination of patient, obtaining history from patient or surrogate, ordering and performing treatments and interventions, ordering and review of laboratory studies, ordering and review of radiographic studies, pulse oximetry and re-evaluation of patient's condition.   Medications Ordered in ED Medications  fentaNYL (SUBLIMAZE) injection 100 mcg (has no administration in time range)  sodium chloride 0.9 % bolus 1,000 mL (has no administration in time range)  vancomycin (VANCOREADY) IVPB 1500 mg/300 mL (has no administration in time range)  ceFEPIme (MAXIPIME) 2 g in sodium chloride 0.9 % 100 mL IVPB (has no administration in time range)    ED Course/ Medical Decision Making/ A&P                             Medical Decision Making Amount and/or Complexity of Data Reviewed Labs: ordered. Radiology: ordered and independent interpretation performed. ECG/medicine tests: ordered and independent interpretation performed.  Risk Prescription drug management. Decision regarding  hospitalization.   52 year old male presenting to the ED with increased right lower leg swelling, worsening pain, and shortness of breath.  Hospitalized for cellulitis of right lower extremity in January, got better while inpatient but worsening upon discharge.  He admits he did not pick up his medications for about 2 weeks as he did not have any money, however has been taking consistently over the past week.  He does admit to taking excessive ibuprofen due to uncontrolled pain.  On exam he does have chronic appearing dermatitis to right lower leg and dorsal foot with surrounding erythema, induration, and warmth to touch.  Lower leg and foot are exquisitely tender to touch but foot is neurovascularly intact.  Labs with leukocytosis on labs at 26,000, normal lactate.  Does have findings of new renal failure today with creatinine of 5.15 (0.89 prior to discharge)  Suspect this may be multifactorial as he was started on amlodipine and lisinopril upon discharge 12/16/2022, however likely largely due to inappropriate NSAID use.  Will give IV fluid bolus.  Denies other ingestion of APAP or salicylates but will check levels just to be sure. Chest x-ray without any acute findings.  Right foot films without soft tissue gas or noted foreign body.  Did have MRI 12/13/2022 for same without any findings of osteomyelitis or deep space infection of the foot.  Will obtain set of blood cultures and re-start IV abx.  He will require admission.  Discussed with hospitalist, Dr. Velia Meyer-- will admit for ongoing care.  Final Clinical Impression(s) / ED Diagnoses Final diagnoses:  Acute renal failure, unspecified acute renal failure type (HCC)  Excessive use of nonsteroidal anti-inflammatory drug (NSAID)  Cellulitis of right lower extremity    Rx / DC Orders ED Discharge Orders     None         Larene Pickett, PA-C 01/17/23 0136    Maudie Flakes, MD 01/17/23 [phone]

## 2023-01-17 NOTE — H&P (Signed)
History and Physical      Timothy Mcdowell [id number] DOB: Jul 29, 1971 DOA: 01/16/2023  PCP: Renee Rival, FNP  Patient coming from: home   I have personally briefly reviewed patient's old medical records in [place]  Chief Complaint: Right foot pain  HPI: Timothy Mcdowell is a 52 y.o. male with medical history significant for essential hypertension, chronic tobacco abuse, who is admitted to [facility] on 01/16/2023 with severe sepsis due to cellulitis of the right foot, after presenting from home to [facility] ED complaining of right foot pain.   The patient was recently hospitalized at [facility] from 12/13/2022 to 12/16/2022 for cellulitis of the right foot.  Over the course of the hospitalization, he received IV antibiotics, within 2 improvement in his right foot erythema, swelling, tenderness.  He was ultimately discharged home on Keflex, although the patient reports that there was a significant gap between the time of discharge from the hospital until he was able to afford his outpatient medications, noting that he was off antibiotics for approximately 2 weeks before being able to procure the aforementioned Keflex.  Over the course, he has noted further progression in his right foot erythema, swelling, tenderness, increased warmth, with these findings over the dorsal aspect of the right foot, with proximal extension over the anterior aspect of the right lower extremity, terminating a few inches proximal to the right ankle.  Otherwise, denies any rash in any additional location.  Denies any interval trauma to the right lower extremity.  Denies any associated numbness or paresthesias.  No acute focal weakness.  Also denies any associated subjective fever, chills, rigors, generalized myalgias.  No known history of underlying diabetes.  Denies any associated drainage, including no purulent drainage.  Of note, MRI performed during this most recent prior hospitalization showed no evidence to  suggest underlying osteomyelitis.  In attempting to manage the progressive nature of the pain associate with his right foot, the patient reports that he has been taking escalating doses of ibuprofen as an outpatient over the last few weeks.  He specifically reports that on a typical day over the last 3 weeks that he is taking 1800 mg of ibuprofen 3 times a day.  This is in addition to recently initiated daily lisinopril.   Per chart review, baseline hemoglobin range appears to be 12-13.  Not on any blood thinners as an outpatient.     ED Course:  Vital signs in the ED were notable for the following: Afebrile; heart rate 10 3-1 tens; systolic blood pressures in the 120s 140s; respiratory rate 20, oxygen saturation 94 to 100% on room air.  Labs were notable for the following: CMP notable for sodium 134, bicarbonate 20, anion gap 13, creatinine 5.15 compared to most recent prior serum creatinine did point to 0.89 on 12/29/2022, calcium, just of mild anemia noted to be 9.8, albumin 2.6, liver enzymes within normal limits.  CBC notable for white blood cell count 26,700 relative to most recent prior will was a count of 7700 on 12/29/2021, with today's white blood cell count associated with 84% neutrophils, hemoglobin 10.6 associated with microcytic finding as well as normochromic finding and mildly elevated RDW 18.6, relative dementias prior hemoglobin did will of 12.3 on 12/29/2022.  Lactic acid 1.2.  Blood cultures x 2 were collected prior to initiation of IV antibiotics.  Per my interpretation, EKG in ED demonstrated the following: In comparison to most recent prior EKG from 06/04/2022, presenting EKG shows sinus rhythm with  heart rate 100, normal intervals, nonspecific T wave inversion in leads III and aVF, which to inversion in lead III appears unchanged from most recent prior EKG, will demonstrating no evidence of ST changes, clearness of ST ovation.  Imaging and additional notable ED work-up: 2 view  chest x-ray shows no evidence of acute cardiopulmonary process, including evidence of infiltrate, edema, effusion, or pneumothorax.  Plain films of the right foot show severe soft tissue edema without evidence of subcutaneous gas foreign body, or acute osseous abnormality.  While in the ED, the following were administered: Fentanyl 100 mcg IV x 1, cefepime, IV vancomycin, normal saline nasal intervals.  Subsequently, the patient was admitted for further evaluation and management of severe sepsis due to cellulitis of the right foot, with presentation also associate with acute kidney injury as well as acute anemia.    Review of Systems: As per HPI otherwise 10 point review of systems negative.   Past Medical History:  Diagnosis Date   Hypertension     History reviewed. No pertinent surgical history.  Social History:  reports that he has been smoking. He has never used smokeless tobacco. He reports current drug use. Drug: Marijuana. He reports that he does not drink alcohol.   Allergies  Allergen Reactions   Other Rash    strawberries    Family History  Problem Relation Age of Onset   Hypertension Mother     Family history reviewed and not pertinent    Prior to Admission medications   Medication Sig Start Date End Date Taking? Authorizing Provider  acetaminophen (TYLENOL) 500 MG tablet Take 500 mg by mouth every 6 (six) hours as needed.    [provider]  amLODipine (NORVASC) 5 MG tablet Take 1 tablet (5 mg total) by mouth daily. 12/29/22   Renee Rival, FNP  hydrOXYzine (VISTARIL) 25 MG capsule Take 1 capsule (25 mg total) by mouth every 8 (eight) hours as needed for itching. 12/29/22   Paseda, Dewaine Conger, FNP  ibuprofen (ADVIL) 600 MG tablet Take 1 tablet (600 mg total) by mouth every 8 (eight) hours as needed. 12/29/22   Paseda, Dewaine Conger, FNP  lisinopril (ZESTRIL) 20 MG tablet Take 1 tablet (20 mg total) by mouth daily. 12/29/22   Renee Rival, FNP   triamcinolone cream (KENALOG) 0.1 % Apply 1 Application topically 2 (two) times daily. 12/29/22   Renee Rival, FNP     Objective    Physical Exam: Vitals:   01/16/23 2328 01/16/23 2334 01/17/23 0145  BP: (!) 148/68  123/69  Pulse: (!) 103  (!) 110  Resp: 20  20  Temp: (!) 97.3 F (36.3 C)  97.7 F (36.5 C)  TempSrc: Oral    SpO2: 94%  100%  Weight:  79.4 kg   Height:  5' 10"$  (1.778 m)     General: appears to be stated age; alert, oriented Skin: warm, dry, erythema over the dorsal surface of the right foot with proximal extension over the anterior aspect of the right lower extremity, terminating a few inches proximal to the right ankle and associated with increased warmth, tenderness, and swelling, in the absence of any associated crepitus. Head:  AT/[place] Mouth:  Oral mucosa membranes appear dry, normal dentition Neck: supple; trachea midline Heart:  RRR; did not appreciate any M/R/G Lungs: CTAB, did not appreciate any wheezes, rales, or rhonchi Abdomen: + BS; soft, ND, NT Vascular: 2+ pedal pulses b/l; 2+ radial pulses b/l Extremities: No muscle wasting; right  lower extremity erythema, tenderness, increased warmth, swelling, as further described above, in the absence of any associated drainage. Neuro: strength and sensation intact in upper and lower extremities b/l     Labs on Admission: I have personally reviewed following labs and imaging studies  CBC: Recent Labs  Lab 01/16/23 2345  WBC 26.7*  NEUTROABS 22.8*  HGB 10.6*  HCT 31.5*  MCV 57.2*  PLT [id number]   Basic Metabolic Panel: Recent Labs  Lab 01/16/23 2345  NA 134*  K 3.8  CL 101  CO2 20*  GLUCOSE 137*  BUN 57*  CREATININE 5.15*  CALCIUM 8.6*   GFR: Estimated Creatinine Clearance: 17.3 mL/min (A) (by C-G formula based on SCr of 5.15 mg/dL (H)). Liver Function Tests: Recent Labs  Lab 01/16/23 2345  AST 23  ALT 23  ALKPHOS 87  BILITOT 0.7  PROT 7.0  ALBUMIN 2.6*   No results for  input(s): "LIPASE", "AMYLASE" in the last 168 hours. No results for input(s): "AMMONIA" in the last 168 hours. Coagulation Profile: No results for input(s): "INR", "PROTIME" in the last 168 hours. Cardiac Enzymes: No results for input(s): "CKTOTAL", "CKMB", "CKMBINDEX", "TROPONINI" in the last 168 hours. BNP (last 3 results) No results for input(s): "PROBNP" in the last 8760 hours. HbA1C: No results for input(s): "HGBA1C" in the last 72 hours. CBG: No results for input(s): "GLUCAP" in the last 168 hours. Lipid Profile: No results for input(s): "CHOL", "HDL", "LDLCALC", "TRIG", "CHOLHDL", "LDLDIRECT" in the last 72 hours. Thyroid Function Tests: No results for input(s): "TSH", "T4TOTAL", "FREET4", "T3FREE", "THYROIDAB" in the last 72 hours. Anemia Panel: No results for input(s): "VITAMINB12", "FOLATE", "FERRITIN", "TIBC", "IRON", "RETICCTPCT" in the last 72 hours. Urine analysis:    Component Value Date/Time   COLORURINE AMBER (A) 08/11/2016 1151   APPEARANCEUR CLEAR 08/11/2016 1151   LABSPEC 1.026 08/11/2016 1151   PHURINE 7.0 08/11/2016 1151   GLUCOSEU NEGATIVE 08/11/2016 1151   HGBUR NEGATIVE 08/11/2016 1151   BILIRUBINUR SMALL (A) 08/11/2016 1151   KETONESUR 15 (A) 08/11/2016 1151   PROTEINUR NEGATIVE 08/11/2016 1151   NITRITE NEGATIVE 08/11/2016 1151   LEUKOCYTESUR NEGATIVE 08/11/2016 1151    Radiological Exams on Admission: DG Foot Complete Right  Result Date: 01/17/2023 CLINICAL DATA:  Penetrating injury with a nail. Increasing swelling. EXAM: RIGHT FOOT COMPLETE - 3+ VIEW COMPARISON:  Study of 12/12/2022 FINDINGS: There is artifact from an overlying sock. Severe edema continues to be seen, greatest superiorly. A small accessory os supranaviculare is again noted on the lateral view and an os trigonum posteriorly. There is mild hallux valgus with otherwise normal interosseous alignment. Mild features of midfoot arthrosis. No foreign body is seen. There is no visible soft  tissue gas. Left orbital fracture, dislocation or destructive bone lesions. IMPRESSION: 1. Severe soft tissue edema without evidence of soft tissue gas or appreciable foreign body. 2. No acute osseous findings. Electronically Signed   By: Telford Nab M.D.   On: 01/17/2023 00:23   DG Chest 2 View  Result Date: 01/17/2023 CLINICAL DATA:  Shortness of breath. EXAM: CHEST - 2 VIEW COMPARISON:  Chest radiograph dated 06/03/2022. FINDINGS: No focal consolidation, pleural effusion, pneumothorax. Top-normal cardiac size. No acute osseous pathology. IMPRESSION: No active cardiopulmonary disease. Electronically Signed   By: Anner Crete M.D.   On: 01/17/2023 00:16      Assessment/Plan   Principal Problem:   Cellulitis of right foot Active Problems:   Essential hypertension   Tobacco abuse   Severe sepsis ([place])  AKI (acute kidney injury) ([place])   Acute anemia      #) Severe sepsis due to severe right lower extremity cellulitis: Diagnosis on the basis of right lower extremity erythema associated with tenderness, increased warmth to touch, swelling, with imaging in the form of plain films of the right foot demonstrating severe subcutaneous edema consistent with cellulitis.  No associated with any purulent drainage.  No evidence of crepitus on exam or evidence of subcutaneous gas on imaging to suggest underlying necrotizing fasciitis.  No evidence of bone demineralization on imaging to suggest underlying osteomyelitis, consistent with MRI performed during the hospitalization, which also showed no radiographic evidence of osteomyelitis.  Of note, the right lower extremity is found to be neurovascularly intact.  SIRS criteria met via interval development of leukocytosis, with presenting vital signs notable for tachycardia. Lactic acid level: 1.2. Of note, given the associated presence of suspected end organ damage in the form of concominant presenting acute kidney injury, criteria are met for pt's  sepsis to be considered severe in nature. However, in the absence of lactic acid level that is greater than or equal to 4.0, and in the absence of any associated hypotension refractory to IVF's, there are no indications for administration of a 30 mL/kg IVF bolus at this time.   presentation meets criteria to be considered of severe cellulitis due to associated sepsis. Overall, in the setting of presenting severe non-purulent cellulitis, will initiate IV Vanc and Cefepime per recommendation by antibiotic stewardship committee.  In terms of predisposing factors leading to worsening of right lower extremity cellulitis, suspect contribution from incomplete antibiotic treatment of previously noted cellulitis involving the right lower extremity, noting that the patient was able to complete his prescribed course of Keflex upon discharge from hospital, as further noted above.   Additional ED work-up/management notable for: Collection of blood cultures x 2 followed by initiation of IV vancomycin and cefepime. No e/o additional infectious process at this time, including chest x-ray which showed no evidence of acute cardiopulmonary process.   Plan: Abx in form of IV vancomycin and cefepime, as above. Follow for results of blood cultures x2 collected today. repeat CBC with diff in AM. I have placed nursing communication orders asking that current distribution of erythema be outlined, and that affected extremity be elevated to help decrease associated swelling.  Lactated Ringer's at 100 cc/h x 10 hours.  Check urinalysis.  As needed IV Dilaudid.  Counseled the patient on the importance of smoking discontinuation to promote optimization of healing.  Monitor on telemetry.            #) Acute Kidney Injury:  as quantified above.  Suspect this is multifactorial in nature, with contribution from severe sepsis in the setting of right lower extremity cellulitis.  However, there also appears to be a significant  contribution from pharmacologic factors, namely significant interval usage of ibuprofen of nearly 6 g/day for the last few weeks concomitant with interval initiation of lisinopril impacting both the afferent and efferent arterioles of the nephro resulting in significant decline in creatinine clearance.   Plan: monitor strict I's & O's and daily weights. Attempt to avoid nephrotoxic agents.  Hold home lisinopril.  Refrain from NSAIDs. Repeat CMP in the morning. Check serum magnesium level.  Check urinalysis with microscopy add-on random urine sodium and random urine creatinine.  Further evaluation management of presenting severe sepsis due to right lower extremity cellulitis, as above.  Continue with IV fluids, as above.  if renal function  does not improve with the above measures, can consider obtaining a renal US to evaluate for parenchymal abnormality as well as to assess for evidence of post-renal obstructive process.               #) Acute anemia: Relatively slight hemoglobin range of 12-13, presenting hemoglobin found to be 10.6, representing decline from 12.3 on 12/29/2022.  No evidence of active bleed.  Rather, suspect that this interval decline in hemoglobin is as a consequence of interval decline in renal function, as outlined above.  Will further assess with iron studies and additional laboratory evaluation as outlined below.   Plan: Add on iron studies, reticulocyte count.  Check INR.  Repeat CBC in the morning.  Further evaluation management presenting acute kidney injury, as above.              #) Essential Hypertension: documented h/o such, with outpatient antihypertensive regimen including amlodipine as well as recently initiated lisinopril.  SBP's in the ED today: 120s 140s mmHg. will hold home lisinopril for now and in the context of presenting acute kidney injury  Plan: Close monitoring of subsequent BP via routine VS. resume home amlodipine, but hold home  lisinopril for now, as above.               #) Chronic tobacco abuse: Patient conveys that they are a current smoker, having smoked half ppd for greater than 30 years.   Plan: Counseled the patient for less than 2 minutes on the importance of complete smoking discontinuation.  Order placed for prn nicotine patch for use during this hospitalization.       DVT prophylaxis: Heparin 50,000 units SQ 3 times daily Code Status: Full code Family Communication: none Disposition Plan: Per Rounding Team Consults called: none;  Admission status: Inpatient     I SPENT GREATER THAN 75  MINUTES IN CLINICAL CARE TIME/MEDICAL DECISION-MAKING IN COMPLETING THIS ADMISSION.      [place] DO [facility]  From [place]   01/17/2023, 2:29 AM

## 2023-01-17 NOTE — Progress Notes (Signed)
Pharmacy Antibiotic Note  Timothy Mcdowell is a 52 y.o. male admitted on 01/16/2023 with AKI and RLE cellulitis.  Pharmacy has been consulted to switch from Vancomycin to an alternative agent that has less renal dysfunction associated with it. Will start Linezolid.   Plan: Linezolid 600 mg IV q12hr  Height: 5' 10"$  (177.8 cm) Weight: 81.4 kg (179 lb 7.3 oz) IBW/kg (Calculated) : 73  Temp (24hrs), Avg:98 F (36.7 C), Min:97.3 F (36.3 C), Max:98.7 F (37.1 C)  Recent Labs  Lab 01/16/23 2345 01/17/23 0124 01/17/23 0422  WBC 26.7*  --  29.1*  CREATININE 5.15*  --  4.96*  LATICACIDVEN 1.2 1.0  --      Estimated Creatinine Clearance: 18 mL/min (A) (by C-G formula based on SCr of 4.96 mg/dL (H)).    Allergies  Allergen Reactions   Other Rash    strawberries     Alanda Slim, PharmD, [facility] Clinical Pharmacist Please see AMION for all Pharmacists' Contact Phone Numbers 01/17/2023, 3:08 PM

## 2023-01-18 DIAGNOSIS — L03115 Cellulitis of right lower limb: Secondary | ICD-10-CM | POA: Diagnosis not present

## 2023-01-18 LAB — BASIC METABOLIC PANEL
Anion gap: 12 (ref 5–15)
BUN: 40 mg/dL — ABNORMAL HIGH (ref 6–20)
CO2: 18 mmol/L — ABNORMAL LOW (ref 22–32)
Calcium: 8.4 mg/dL — ABNORMAL LOW (ref 8.9–10.3)
Chloride: 108 mmol/L (ref 98–111)
Creatinine, Ser: 2.85 mg/dL — ABNORMAL HIGH (ref 0.61–1.24)
GFR, Estimated: 26 mL/min — ABNORMAL LOW (ref >60)
Glucose, Bld: 89 mg/dL (ref 70–99)
Potassium: 3.8 mmol/L (ref 3.5–5.1)
Sodium: 138 mmol/L (ref 135–145)

## 2023-01-18 LAB — CBC
HCT: 26.3 % — ABNORMAL LOW (ref 39.0–52.0)
Hemoglobin: 9.4 g/dL — ABNORMAL LOW (ref 13.0–17.0)
MCH: 19.5 pg — ABNORMAL LOW (ref 26.0–34.0)
MCHC: 35.7 g/dL (ref 30.0–36.0)
MCV: 54.6 fL — ABNORMAL LOW (ref 80.0–100.0)
Platelets: 230 10*3/uL (ref 150–400)
RBC: 4.82 MIL/uL (ref 4.22–5.81)
RDW: 17.7 % — ABNORMAL HIGH (ref 11.5–15.5)
WBC: 28.9 10*3/uL — ABNORMAL HIGH (ref 4.0–10.5)
nRBC: 0.1 % (ref 0.0–0.2)

## 2023-01-18 MED ORDER — HYDROMORPHONE HCL 1 MG/ML IJ SOLN
1.0000 mg | Freq: Once | INTRAMUSCULAR | Status: AC
  Administered 2023-01-18: 1 mg via INTRAVENOUS
  Filled 2023-01-18: qty 1

## 2023-01-18 MED ORDER — HYDROCODONE-ACETAMINOPHEN 5-325 MG PO TABS
1.0000 | ORAL_TABLET | ORAL | Status: DC | PRN
  Administered 2023-01-18 – 2023-01-20 (×6): 2 via ORAL
  Administered 2023-01-20: 1 via ORAL
  Administered 2023-01-21 – 2023-01-25 (×21): 2 via ORAL
  Administered 2023-01-25: 1 via ORAL
  Administered 2023-01-25 – 2023-01-28 (×12): 2 via ORAL
  Filled 2023-01-18 (×42): qty 2
  Filled 2023-01-18: qty 1

## 2023-01-18 MED ORDER — CLONAZEPAM 0.125 MG PO TBDP
0.5000 mg | ORAL_TABLET | Freq: Once | ORAL | Status: AC
  Administered 2023-01-18: 0.5 mg via ORAL
  Filled 2023-01-18: qty 4

## 2023-01-18 NOTE — Progress Notes (Signed)
PROGRESS NOTE Timothy Mcdowell  [id number] DOB: 1971-04-28 DOA: 01/16/2023 PCP: Renee Rival, FNP   Brief Narrative/Hospital Course: 52 y.o. male with medical history significant for essential hypertension, chronic tobacco abuse presented with worsening swelling and pain on his right lower leg, was recently admitted in January for same and was treated with IV antibiotics and discharged home on oral antibiotics but for the past 2 weeks patient noticing worsening of the infection.  He was initially not taking ibuprofen or Keflex as he did not have any money however has since picked up the medication as has been taking for past several days without much relief. In the ED vital signs stable BP tachycardia afebrile labs with leukocytosis metabolic acidosis AKI. EKG sinus rhythm nonspecific T wave changes, chest x-ray no evidence of acute cardiopulmonary process, patient was admitted for further evaluation of sepsis with secondary cellulitis and AKI and anemia.    Subjective: Seen and examined this morning appears somewhat upset about pain and that he was discharged early on last admission Overnight patient has been normotensive, mild tachycardia, on room air, afebrile Labs shows improving creatinine at 2.8 persistent leukocytosis Has been refusing pain medication due to fear of worsening AKI   Assessment and Plan: Principal Problem:   Cellulitis of right foot Active Problems:   Essential hypertension   Tobacco abuse   Severe sepsis (HCC)   AKI (acute kidney injury) ([place])   Acute anemia  Right lower extremity cellulitis with associated severe sepsis POA Recent admission for right lower extremity first week of January: His MRI from 1/6 right foot showed cellulitis, no evidence of septic arthritis or osteomyelitis.  X-ray foot in the ED showed severe soft tissue edema without soft tissue gas or foreign body or osseous changes.  Continues to have pain, pain medication added, continue with  cefepime, Zyvox-vancomycin discontinued due to AKI.  Still has persistent leukocytosis, slow to improve Recent Labs  Lab 01/16/23 2345 01/17/23 0124 01/17/23 0422 01/18/23 0322  WBC 26.7*  --  29.1* 28.9*  LATICACIDVEN 1.2 1.0  --   --     [id number] in the setting of dehydration lisinopril ibuprofen use and also with infection.  Nicely improving appreciate nephrology input, continue gentle IV fluids and monitor. Recent Labs    06/03/22 1009 11/19/22 0101 12/12/22 1834 12/13/22 0816 12/14/22 0205 12/29/22 1412 01/16/23 2345 01/17/23 0422 01/18/23 0457  BUN 16 21* 13 10 15 14 $ 57* 54* 40*  CREATININE 0.95 0.98 1.09 1.10 1.02 0.89 5.15* 4.96* [id number]*    Metabolic acidosis: In the setting of AKI, monitor, continue hydration.  Bicarb at 18.  Anemia microcytic: Baseline hb ~12g,some drop from ivf, no active bleeding>monitor and transfuse if <7.  Anemia panel shows elevated ferritin iron low. Add Po iron on dc. Recent Labs    12/13/22 0816 12/14/22 0205 12/29/22 1412 01/16/23 2345 01/17/23 0422 01/18/23 0322  HGB 12.4* 12.4* 12.3* 10.6* 10.0* 9.4*  MCV 59.6* 59.4* 62* 57.2* 57.7* 54.6*  FERRITIN 208  --   --   --  1,051*  --   TIBC  --   --   --   --  179*  --   IRON  --   --   --   --  23*  --   RETICCTPCT  --   --   --   --  0.6  --      Essential hypertension: BP stable but remains mildly tachycardic, on amlodipine Chronic tobacco abuse: counseled  DVT prophylaxis:  heparin injection 5,000 Units Start: 01/17/23 0600 Code Status:   Code Status: Full Code  Family Communication: plan of care discussed with patient at bedside. Patient status is: Inpatient because of foot infection and AKI Level of care: Telemetry Medical   Dispo: The patient is from: Home            Anticipated disposition: Home 2-3 days Objective: Vitals last 24 hrs: Vitals:   01/18/23 0118 01/18/23 0422 01/18/23 0600 01/18/23 0745  BP: 132/65 116/62 (!) 143/72 (!) 142/74  Pulse: (!) 108 (!)  103 (!) 114 (!) 113  Resp: (!) 29 (!) 25 (!) 25   Temp: 98 F (36.7 C) 98 F (36.7 C) 98.6 F (37 C) 98.5 F (36.9 C)  TempSrc: Oral Oral Oral Oral  SpO2: 96% 92% 97% 95%  Weight:   84.1 kg   Height:       Weight change: 4.721 kg  Physical Examination: General exam: alert awake, anxious upset older than stated age HEENT:Oral mucosa moist, Ear/Nose WNL grossly Respiratory system: bilaterally clear BS, no use of accessory muscle Cardiovascular system: S1 & S2 +, No JVD. Gastrointestinal system: Abdomen soft,NT,ND, BS+ Nervous System:Alert, awake, moving extremities. Extremities: LE edema + in /l foot with vesicular eruption- swelling, Skin: No rashes,no icterus. MSK: Normal muscle bulk,tone, power  Medications reviewed:  Scheduled Meds:  amLODipine  5 mg Oral Daily   heparin  5,000 Units Subcutaneous Q8H   influenza vac split quadrivalent PF  0.5 mL Intramuscular Tomorrow-1000   Continuous Infusions:  ceFEPime (MAXIPIME) IV Stopped (01/17/23 2150)   lactated ringers 125 mL/hr at 01/18/23 0420   linezolid (ZYVOX) IV Stopped (01/17/23 1744)    Diet Order             Diet regular Room service appropriate? Yes; Fluid consistency: Thin  Diet effective now                  Intake/Output Summary (Last 24 hours) at 01/18/2023 0859 Last data filed at 01/18/2023 0618 Gross per 24 hour  Intake 2604.67 ml  Output 3050 ml  Net -445.33 ml   Net IO Since Admission: 1,387.76 mL [01/18/23 0859]  Wt Readings from Last 3 Encounters:  01/18/23 84.1 kg  12/29/22 79.5 kg  12/16/22 78 kg     Unresulted Labs (From admission, onward)     Start     Ordered   01/18/23 [id number]  Basic metabolic panel  Daily,   R     Question:  Specimen collection method  Answer:  Lab=Lab collect   01/17/23 0738   01/18/23 0500  CBC  Daily,   R     Question:  Specimen collection method  Answer:  Lab=Lab collect   01/17/23 0738   01/17/23 1447  MRSA Next Gen by PCR, Nasal  ONCE - URGENT,   URGENT         01/17/23 1446          Data Reviewed: I have personally reviewed following labs and imaging studies CBC: Recent Labs  Lab 01/16/23 2345 01/17/23 0422 01/18/23 0322  WBC 26.7* 29.1* 28.9*  NEUTROABS 22.8* 27.4*  --   HGB 10.6* 10.0* 9.4*  HCT 31.5* 30.4* 26.3*  MCV 57.2* 57.7* 54.6*  PLT 224 195 [id number]   Basic Metabolic Panel: Recent Labs  Lab 01/16/23 2345 01/17/23 0124 01/17/23 0422 01/18/23 0457  NA 134*  --  136 138  K 3.8  --  3.7 3.8  CL 101  --  103 108  CO2 20*  --  17* 18*  GLUCOSE 137*  --  107* 89  BUN 57*  --  54* 40*  CREATININE 5.15*  --  4.96* 2.85*  CALCIUM 8.6*  --  8.1* 8.4*  MG  --  2.3 2.3  --    GFR: Estimated Creatinine Clearance: 31.3 mL/min (A) (by C-G formula based on SCr of 2.85 mg/dL (H)). Liver Function Tests: Recent Labs  Lab 01/16/23 2345 01/17/23 0422  AST 23 23  ALT 23 22  ALKPHOS 87 100  BILITOT 0.7 1.2  PROT 7.0 6.4*  ALBUMIN 2.6* 2.4*   No results for input(s): "LIPASE", "AMYLASE" in the last 168 hours. No results for input(s): "AMMONIA" in the last 168 hours. Coagulation Profile: Recent Labs  Lab 01/17/23 0422  INR 1.4*   BNP (last 3 results) No results for input(s): "PROBNP" in the last 8760 hours. HbA1C: No results for input(s): "HGBA1C" in the last 72 hours. CBG: No results for input(s): "GLUCAP" in the last 168 hours. Lipid Profile: No results for input(s): "CHOL", "HDL", "LDLCALC", "TRIG", "CHOLHDL", "LDLDIRECT" in the last 72 hours. Thyroid Function Tests: No results for input(s): "TSH", "T4TOTAL", "FREET4", "T3FREE", "THYROIDAB" in the last 72 hours. Sepsis Labs: Recent Labs  Lab 01/16/23 2345 01/17/23 0124  LATICACIDVEN 1.2 1.0    Recent Results (from the past 240 hour(s))  Blood culture (routine x 2)     Status: None (Preliminary result)   Collection Time: 01/17/23  1:24 AM   Specimen: BLOOD RIGHT ARM  Result Value Ref Range Status   Specimen Description BLOOD RIGHT ARM  Final   Special  Requests   Final    BOTTLES DRAWN AEROBIC AND ANAEROBIC Blood Culture results may not be optimal due to an inadequate volume of blood received in culture bottles   Culture   Final    NO GROWTH 1 DAY Performed at [facility], [place] [address], [place], [place] [address]    Report Status PENDING  Incomplete  Blood culture (routine x 2)     Status: None (Preliminary result)   Collection Time: 01/17/23  1:24 AM   Specimen: BLOOD LEFT ARM  Result Value Ref Range Status   Specimen Description BLOOD LEFT ARM  Final   Special Requests   Final    BOTTLES DRAWN AEROBIC AND ANAEROBIC Blood Culture results may not be optimal due to an inadequate volume of blood received in culture bottles   Culture   Final    NO GROWTH 1 DAY Performed at [facility], [place] [address] [place], [place] [address]    Report Status PENDING  Incomplete    Antimicrobials: Anti-infectives (From admission, onward)    Start     Dose/Rate Route Frequency Ordered Stop   01/17/23 2200  ceFEPIme (MAXIPIME) 2 g in sodium chloride 0.9 % 100 mL IVPB        2 g 200 mL/hr over 30 Minutes Intravenous Every 24 hours 01/17/23 0245     01/17/23 1600  linezolid (ZYVOX) IVPB 600 mg        600 mg 300 mL/hr over 60 Minutes Intravenous Every 12 hours 01/17/23 1507     01/17/23 0245  vancomycin variable dose per unstable renal function (pharmacist dosing)  Status:  Discontinued         Does not apply See admin instructions 01/17/23 0245 01/17/23 1507   01/17/23 0100  vancomycin (VANCOREADY) IVPB 1500 mg/300 mL  1,500 mg 150 mL/hr over 120 Minutes Intravenous  Once 01/17/23 0057 01/17/23 0354   01/17/23 0100  ceFEPIme (MAXIPIME) 2 g in sodium chloride 0.9 % 100 mL IVPB        2 g 200 mL/hr over 30 Minutes Intravenous  Once 01/17/23 0057 01/17/23 0256      Culture/Microbiology    Component Value Date/Time   SDES BLOOD RIGHT ARM 01/17/2023 0124   SDES BLOOD LEFT ARM 01/17/2023 0124   SPECREQUEST  01/17/2023 0124     BOTTLES DRAWN AEROBIC AND ANAEROBIC Blood Culture results may not be optimal due to an inadequate volume of blood received in culture bottles   SPECREQUEST  01/17/2023 0124    BOTTLES DRAWN AEROBIC AND ANAEROBIC Blood Culture results may not be optimal due to an inadequate volume of blood received in culture bottles   CULT  01/17/2023 0124    NO GROWTH 1 DAY Performed at [facility], [place] [address] [place], [place] [address]    CULT  01/17/2023 0124    NO GROWTH 1 DAY Performed at [facility] [address], [place], [place] [address]    REPTSTATUS PENDING 01/17/2023 0124   REPTSTATUS PENDING 01/17/2023 0124    Radiology Studies: US RENAL  Result Date: 01/17/2023 CLINICAL DATA:  Acute kidney injury EXAM: RENAL / URINARY TRACT ULTRASOUND COMPLETE COMPARISON:  CT abdomen 03/28/2017 FINDINGS: Right Kidney: Renal measurements: 10.2 x 5.2 x 6.3 cm = volume: 176 mL. Echogenicity within normal limits. No mass or hydronephrosis visualized. Left Kidney: Renal measurements: 10.5 x 6.5 x 5.3 cm = volume: 189 mL. Echogenicity within normal limits. No mass or hydronephrosis visualized. Bladder: Appears normal for degree of bladder distention. Other: None. IMPRESSION: 1. Normal renal ultrasound. Electronically Signed   By: Kathreen Devoid M.D.   On: 01/17/2023 09:57   DG Foot Complete Right  Result Date: 01/17/2023 CLINICAL DATA:  Penetrating injury with a nail. Increasing swelling. EXAM: RIGHT FOOT COMPLETE - 3+ VIEW COMPARISON:  Study of 12/12/2022 FINDINGS: There is artifact from an overlying sock. Severe edema continues to be seen, greatest superiorly. A small accessory os supranaviculare is again noted on the lateral view and an os trigonum posteriorly. There is mild hallux valgus with otherwise normal interosseous alignment. Mild features of midfoot arthrosis. No foreign body is seen. There is no visible soft tissue gas. Left orbital fracture, dislocation or destructive bone lesions.  IMPRESSION: 1. Severe soft tissue edema without evidence of soft tissue gas or appreciable foreign body. 2. No acute osseous findings. Electronically Signed   By: Telford Nab M.D.   On: 01/17/2023 00:23   DG Chest 2 View  Result Date: 01/17/2023 CLINICAL DATA:  Shortness of breath. EXAM: CHEST - 2 VIEW COMPARISON:  Chest radiograph dated 06/03/2022. FINDINGS: No focal consolidation, pleural effusion, pneumothorax. Top-normal cardiac size. No acute osseous pathology. IMPRESSION: No active cardiopulmonary disease. Electronically Signed   By: Anner Crete M.D.   On: 01/17/2023 00:16     LOS: 1 day   Antonieta Pert, MD [facility]  01/18/2023, 8:59 AM

## 2023-01-18 NOTE — Progress Notes (Signed)
[facility] Progress Note  Subjective: seen in room, in pain and asking for "whatever they gave me last time", in January when he was here.   Vitals:   01/18/23 0422 01/18/23 0600 01/18/23 0745 01/18/23 1800  BP: 116/62 (!) 143/72 (!) 142/74 (!) 145/88  Pulse: (!) 103 (!) 114 (!) 113 97  Resp: (!) 25 (!) 25  20  Temp: 98 F (36.7 C) 98.6 F (37 C) 98.5 F (36.9 C)   TempSrc: Oral Oral Oral   SpO2: 92% 97% 95%   Weight:  84.1 kg    Height:        Exam: Gen alert, no distress No jvd Chest clear bilat to bases RRR no MRG Abd soft ntnd no mass or ascites +bs Ext pretib/ pedal blistering and erythema Also bilat pretib 1+ edema but w/ sig wrinkling Neuro is alert, Ox 3 , nf        Home meds include - norvasc, advil, lisinopril 20 qd, prns       Na 136  K 3.7  CO2 17  AG 16  BUN 54  creat 4.96      UA 2/10 - rare bact, 30 prot, 0-5 rbc/ wbc      UNa 73, UCr 49        Renal US - 10.2 and 10.5 cm kidneys, no hydro, echo wnl   Assessment/ Plan: AKI - b/l creat was normal at 0.89 on Dec 29, 2022. Creat here 5.15 on admission 2/09 in setting of worsening acute on chronic bilat lower leg wounds/ cellulitis. UA negative and renal US unremarkable. BP's were wnl. Suspected AKI due to intravasc volume depletion + lots of nsaid's + ACEi effects. Gave additional bolus yest and cont'd IVF's. Creat down 2.8 today from 4.9 yesterday. Cont IVF's and cont to avoid acei/ nsaids. F/u labs in am.  Sepsis / bilat LE cellulitis - getting IV abx per pmd HTN - BP's wnl, would try to avoid acei/ ARB for this patient until leg infection issues have resolved.       Kelly Splinter MD [facility] 01/18/2023, 6:15 PM  Recent Labs  Lab 01/16/23 2345 01/17/23 0422 01/18/23 0322 01/18/23 0457  HGB 10.6* 10.0* 9.4*  --   ALBUMIN 2.6* 2.4*  --   --   CALCIUM 8.6* 8.1*  --  8.4*  CREATININE 5.15* 4.96*  --  2.85*  K 3.8 3.7  --  3.8   Recent Labs  Lab 01/17/23 0422  IRON 23*  TIBC 179*   FERRITIN 1,051*   Inpatient medications:  amLODipine  5 mg Oral Daily   heparin  5,000 Units Subcutaneous Q8H   influenza vac split quadrivalent PF  0.5 mL Intramuscular Tomorrow-1000    ceFEPime (MAXIPIME) IV Stopped (01/17/23 2150)   lactated ringers 125 mL/hr at 01/18/23 0420   linezolid (ZYVOX) IV 600 mg (01/18/23 1309)   acetaminophen **OR** acetaminophen, HYDROcodone-acetaminophen, HYDROmorphone (DILAUDID) injection, melatonin, naLOXone (NARCAN)  injection, nicotine, ondansetron (ZOFRAN) IV

## 2023-01-18 NOTE — Progress Notes (Addendum)
Continues to complain about pain, does not want to take Tylenol d/t his elevated " creatine", last creatine was drawn today- 2.85. Respirations and heart rate fluctuants in the mid 100's and upper 120 and respirations in the upper 20's. Pt is asymptomatic and states he feels ok other than pain. Agitation noted because he wants something "better" for pain,  Dilaudid 0.5 mg administered last night. However, he verbalizes that it does not help for long and verbalizes that he is frustrated, rates pain between 6 and 10 intermittent . On call provider notified, new orders for Klonopin 0.45m and Dilaudid increased to 1 mg. Pt was educated on limited options for pain meds d/t his creatine.Pt encouraged to express concerns to day shift provider, pt verbalized understanding.  01/18/23 0118 01/18/23 0126 01/18/23 0201  Vitals  Temp 98 F (36.7 C)  --   --   Temp Source Oral  --   --   BP 132/65  --   --   MAP (mmHg) 85  --   --   BP Location Right Arm  --   --   BP Method Automatic  --   --   Patient Position (if appropriate) Lying  --   --   Pulse Rate (!) 108  --   --   Pulse Rate Source Monitor  --   --   ECG Heart Rate (!) 108  --   --   Resp (!) 29  --   --   Level of Consciousness  Level of Consciousness Alert  --   --   MEWS COLOR  MEWS Score Color Yellow  --   --   Oxygen Therapy  SpO2 96 %  --   --   O2 Device Room Air  --   --   Pain Assessment  Pain Scale  --  0-10  --   Pain Score  --  8 5  Pain Type  --  Acute pain  --   Pain Location  --   (BLE)  --   Pain Orientation  --  Lower  --   Pain Descriptors / Indicators  --  Aching;Constant  --   Pain Frequency  --  Constant  --   Patients Stated Pain Goal  --  0  --   Pain Intervention(s)  --  Medication (See eMAR);MD notified (Comment)  --   Height and Weight  Weight  --   --   --   Type of Scale Used  --   --   --   Type of Weight  --   --   --   BMI (Calculated)  --   --   --     01/18/23 0422 01/18/23 0600  Vitals  Temp 98  F (36.7 C) 98.6 F (37 C)  Temp Source Oral Oral  BP 116/62 (!) 143/72  MAP (mmHg) 71 91  BP Location Right Arm Right Arm  BP Method Automatic Automatic  Patient Position (if appropriate) Lying Lying  Pulse Rate (!) 103 (!) 114  Pulse Rate Source Monitor Monitor  ECG Heart Rate (!) 103 (!) 114  Resp (!) 25 (!) 25  Level of Consciousness  Level of Consciousness Alert Alert  MEWS COLOR  MEWS Score Color Yellow Yellow  Oxygen Therapy  SpO2 92 % 97 %  O2 Device Room Air Room Air  Pain Assessment  Pain Scale 0-10 0-10  Pain Score 7 6  Pain Type Acute pain  --  Pain Location  (BLE)  --   Pain Orientation Lower  --   Pain Descriptors / Indicators Aching;Constant  --   Pain Frequency Constant  --   Patients Stated Pain Goal  --   --   Pain Intervention(s)  --   --   Height and Weight  Weight  --  84.1 kg  Type of Scale Used  --  Bed  Type of Weight  --  Actual  BMI (Calculated)  --  26.6

## 2023-01-19 DIAGNOSIS — L03115 Cellulitis of right lower limb: Secondary | ICD-10-CM | POA: Diagnosis not present

## 2023-01-19 LAB — BASIC METABOLIC PANEL
Anion gap: 10 (ref 5–15)
BUN: 28 mg/dL — ABNORMAL HIGH (ref 6–20)
CO2: 20 mmol/L — ABNORMAL LOW (ref 22–32)
Calcium: 8.3 mg/dL — ABNORMAL LOW (ref 8.9–10.3)
Chloride: 107 mmol/L (ref 98–111)
Creatinine, Ser: 1.81 mg/dL — ABNORMAL HIGH (ref 0.61–1.24)
GFR, Estimated: 44 mL/min — ABNORMAL LOW (ref >60)
Glucose, Bld: 153 mg/dL — ABNORMAL HIGH (ref 70–99)
Potassium: 3.8 mmol/L (ref 3.5–5.1)
Sodium: 137 mmol/L (ref 135–145)

## 2023-01-19 LAB — CBC
HCT: 25.6 % — ABNORMAL LOW (ref 39.0–52.0)
Hemoglobin: 9.1 g/dL — ABNORMAL LOW (ref 13.0–17.0)
MCH: 19.4 pg — ABNORMAL LOW (ref 26.0–34.0)
MCHC: 35.5 g/dL (ref 30.0–36.0)
MCV: 54.5 fL — ABNORMAL LOW (ref 80.0–100.0)
Platelets: 255 10*3/uL (ref 150–400)
RBC: 4.7 MIL/uL (ref 4.22–5.81)
RDW: 17.1 % — ABNORMAL HIGH (ref 11.5–15.5)
WBC: 31.8 10*3/uL — ABNORMAL HIGH (ref 4.0–10.5)
nRBC: 0 % (ref 0.0–0.2)

## 2023-01-19 MED ORDER — SODIUM CHLORIDE 0.9 % IV SOLN
2.0000 g | Freq: Two times a day (BID) | INTRAVENOUS | Status: DC
  Administered 2023-01-19 – 2023-01-20 (×3): 2 g via INTRAVENOUS
  Filled 2023-01-19 (×3): qty 12.5

## 2023-01-19 NOTE — Progress Notes (Signed)
PROGRESS NOTE Onesimo Savino  [id number] DOB: 03-06-1971 DOA: 01/16/2023 PCP: Renee Rival, FNP   Brief Narrative/Hospital Course: 51 y.o. male with medical history significant for essential hypertension, chronic tobacco abuse presented with worsening swelling and pain on his right lower leg, was recently admitted in January for same and was treated with IV antibiotics and discharged home on oral antibiotics but for the past 2 weeks patient noticing worsening of the infection.  He was initially not taking ibuprofen or Keflex as he did not have any money however has since picked up the medication as has been taking for past several days without much relief. In the ED vital signs stable BP tachycardia afebrile labs with leukocytosis metabolic acidosis AKI. EKG sinus rhythm nonspecific T wave changes, chest x-ray no evidence of acute cardiopulmonary process, patient was admitted for further evaluation of sepsis with secondary cellulitis and AKI and anemia.    Subjective: Seen and examined this morning.  He is better manage creatinine improving no new complaints  Labs shows worsening WBC count Denies fever cough nausea vomiting abdominal pain  Assessment and Plan: Principal Problem:   Cellulitis of right foot Active Problems:   Essential hypertension   Tobacco abuse   Severe sepsis (HCC)   AKI (acute kidney injury) ([place])   Acute anemia  Right lower extremity cellulitis with associated severe sepsis POA Recent admission for right lower extremity first week of January: His MRI from 1/6 right foot showed cellulitis, no evidence of septic arthritis or osteomyelitis.  X-ray-severe soft tissue edema w/o soft tissue gas or foreign body or osseous changes. Continues to have pain, cont pain meds. Cont cefepime, Zyvox.slow to improve-with persistent leukocytosis I will touch pace w/ podiatry Recent Labs  Lab 01/16/23 2345 01/17/23 0124 01/17/23 0422 01/18/23 0322 01/19/23 0344  WBC 26.7*   --  29.1* 28.9* 31.8*  LATICACIDVEN 1.2 1.0  --   --   --    [id number] in the setting of dehydration lisinopril ibuprofen use and also with infection.  Nicely improving appreciate nephrology input, continue gentle IV fluids and monitor. Recent Labs    06/03/22 1009 11/19/22 0101 12/12/22 1834 12/13/22 0816 12/14/22 0205 12/29/22 1412 01/16/23 2345 01/17/23 0422 01/18/23 0457 01/19/23 0344  BUN 16 21* 13 10 15 14 $ 57* 54* 40* 28*  CREATININE 0.95 0.98 1.09 1.10 1.02 0.89 5.15* 4.96* 2.85* [id number]     Metabolic acidosis: In the setting of AKI, monitor, continue hydration.  Bicarb improving  Anemia microcytic: Baseline hb ~12g,some drop from ivf, no active bleeding>monitor and transfuse if <7. Anemia panel shows elevated ferritin iron low. Add Po iron on dc. Recent Labs    12/13/22 0816 12/14/22 0205 12/29/22 1412 01/16/23 2345 01/17/23 0422 01/18/23 0322 01/19/23 0344  HGB 12.4*   < > 12.3* 10.6* 10.0* 9.4* 9.1*  MCV 59.6*   < > 62* 57.2* 57.7* 54.6* 54.5*  FERRITIN 208  --   --   --  1,051*  --   --   TIBC  --   --   --   --  179*  --   --   IRON  --   --   --   --  23*  --   --   RETICCTPCT  --   --   --   --  0.6  --   --    < > = values in this interval not displayed.      Essential hypertension: BP remains well-controlled,  but  has been mildly tachycardic, on amlodipine Chronic tobacco abuse: counseled  DVT prophylaxis: heparin injection 5,000 Units Start: 01/17/23 0600 Code Status:   Code Status: Full Code  Family Communication: plan of care discussed with patient at bedside. Patient status is: Inpatient because of foot infection and AKI Level of care: Telemetry Medical   Dispo: The patient is from: Home            Anticipated disposition: Home 2-3 days Objective: Vitals last 24 hrs: Vitals:   01/18/23 2048 01/19/23 0011 01/19/23 0500 01/19/23 0749  BP: (!) 148/88 137/78  135/75  Pulse: (!) 114 (!) 112  (!) 106  Resp: 20 (!) 21    Temp: 98.9 F  (37.2 C) 98.2 F (36.8 C)  99.1 F (37.3 C)  TempSrc: Oral Oral  Oral  SpO2:  100%  96%  Weight:   83.2 kg   Height:       Weight change: -0.9 kg  Physical Examination: General exam: AAox3, weak,older appearing HEENT:Oral mucosa moist, Ear/Nose WNL grossly, dentition normal. Respiratory system: bilaterally clear bS, no use of accessory muscle Cardiovascular system: S1 & S2 +, regular rate. Gastrointestinal system: Abdomen soft, NT,ND,BS+ Nervous System:Alert, awake, moving extremities and grossly nonfocal Extremities: LE ankle edema ++ B/L, LOWER EXTREMITY FOOT W/ tenderness improving eruption Skin: No rashes,no icterus. [id number] muscle bulk,tone, power   Medications reviewed:  Scheduled Meds:  amLODipine  5 mg Oral Daily   heparin  5,000 Units Subcutaneous Q8H   influenza vac split quadrivalent PF  0.5 mL Intramuscular Tomorrow-1000   Continuous Infusions:  ceFEPime (MAXIPIME) IV     lactated ringers 125 mL/hr at 01/18/23 0420   linezolid (ZYVOX) IV 600 mg (01/19/23 0931)    Diet Order             Diet regular Room service appropriate? Yes; Fluid consistency: Thin  Diet effective now                  Intake/Output Summary (Last 24 hours) at 01/19/2023 1337 Last data filed at 01/19/2023 0300 Gross per 24 hour  Intake --  Output 1200 ml  Net -1200 ml    Net IO Since Admission: 187.76 mL [01/19/23 1337]  Wt Readings from Last 3 Encounters:  01/19/23 83.2 kg  12/29/22 79.5 kg  12/16/22 78 kg     Unresulted Labs (From admission, onward)     Start     Ordered   01/18/23 [id number]  Basic metabolic panel  Daily,   R     Question:  Specimen collection method  Answer:  Lab=Lab collect   01/17/23 0738   01/18/23 0500  CBC  Daily,   R     Question:  Specimen collection method  Answer:  Lab=Lab collect   01/17/23 0738   01/17/23 1447  MRSA Next Gen by PCR, Nasal  ONCE - URGENT,   URGENT        01/17/23 1446          Data Reviewed: I have personally reviewed  following labs and imaging studies CBC: Recent Labs  Lab 01/16/23 2345 01/17/23 0422 01/18/23 0322 01/19/23 0344  WBC 26.7* 29.1* 28.9* 31.8*  NEUTROABS 22.8* 27.4*  --   --   HGB 10.6* 10.0* 9.4* 9.1*  HCT 31.5* 30.4* 26.3* 25.6*  MCV 57.2* 57.7* 54.6* 54.5*  PLT 224 195 230 [id number]    Basic Metabolic Panel: Recent Labs  Lab 01/16/23 2345 01/17/23 0124 01/17/23 0422 01/18/23 0457 01/19/23 0344  NA 134*  --  136 138 137  K 3.8  --  3.7 3.8 3.8  CL 101  --  103 108 107  CO2 20*  --  17* 18* 20*  GLUCOSE 137*  --  107* 89 153*  BUN 57*  --  54* 40* 28*  CREATININE 5.15*  --  4.96* 2.85* 1.81*  CALCIUM 8.6*  --  8.1* 8.4* 8.3*  MG  --  2.3 2.3  --   --     GFR: Estimated Creatinine Clearance: 49.3 mL/min (A) (by C-G formula based on SCr of 1.81 mg/dL (H)). Liver Function Tests: Recent Labs  Lab 01/16/23 2345 01/17/23 0422  AST 23 23  ALT 23 22  ALKPHOS 87 100  BILITOT 0.7 1.2  PROT 7.0 6.4*  ALBUMIN 2.6* 2.4*    Recent Labs  Lab 01/17/23 0422  INR 1.4*    Recent Labs  Lab 01/16/23 2345 01/17/23 0124  LATICACIDVEN 1.2 1.0     Recent Results (from the past 240 hour(s))  Blood culture (routine x 2)     Status: None (Preliminary result)   Collection Time: 01/17/23  1:24 AM   Specimen: BLOOD RIGHT ARM  Result Value Ref Range Status   Specimen Description BLOOD RIGHT ARM  Final   Special Requests   Final    BOTTLES DRAWN AEROBIC AND ANAEROBIC Blood Culture results may not be optimal due to an inadequate volume of blood received in culture bottles   Culture   Final    NO GROWTH 2 DAYS Performed at [facility], [place] [address], [place], [place] [address]    Report Status PENDING  Incomplete  Blood culture (routine x 2)     Status: None (Preliminary result)   Collection Time: 01/17/23  1:24 AM   Specimen: BLOOD LEFT ARM  Result Value Ref Range Status   Specimen Description BLOOD LEFT ARM  Final   Special Requests   Final    BOTTLES DRAWN  AEROBIC AND ANAEROBIC Blood Culture results may not be optimal due to an inadequate volume of blood received in culture bottles   Culture   Final    NO GROWTH 2 DAYS Performed at [facility], [place] [address] [place], [place] [address]    Report Status PENDING  Incomplete    Antimicrobials: Anti-infectives (From admission, onward)    Start     Dose/Rate Route Frequency Ordered Stop   01/19/23 1000  ceFEPIme (MAXIPIME) 2 g in sodium chloride 0.9 % 100 mL IVPB        2 g 200 mL/hr over 30 Minutes Intravenous Every 12 hours 01/19/23 0738     01/17/23 2200  ceFEPIme (MAXIPIME) 2 g in sodium chloride 0.9 % 100 mL IVPB  Status:  Discontinued        2 g 200 mL/hr over 30 Minutes Intravenous Every 24 hours 01/17/23 0245 01/19/23 0738   01/17/23 1600  linezolid (ZYVOX) IVPB 600 mg        600 mg 300 mL/hr over 60 Minutes Intravenous Every 12 hours 01/17/23 1507     01/17/23 0245  vancomycin variable dose per unstable renal function (pharmacist dosing)  Status:  Discontinued         Does not apply See admin instructions 01/17/23 0245 01/17/23 1507   01/17/23 0100  vancomycin (VANCOREADY) IVPB 1500 mg/300 mL        1,500 mg 150 mL/hr over 120 Minutes Intravenous  Once 01/17/23 0057 01/17/23  0354   01/17/23 0100  ceFEPIme (MAXIPIME) 2 g in sodium chloride 0.9 % 100 mL IVPB        2 g 200 mL/hr over 30 Minutes Intravenous  Once 01/17/23 0057 01/17/23 0256      Culture/Microbiology    Component Value Date/Time   SDES BLOOD RIGHT ARM 01/17/2023 0124   SDES BLOOD LEFT ARM 01/17/2023 0124   SPECREQUEST  01/17/2023 0124    BOTTLES DRAWN AEROBIC AND ANAEROBIC Blood Culture results may not be optimal due to an inadequate volume of blood received in culture bottles   SPECREQUEST  01/17/2023 0124    BOTTLES DRAWN AEROBIC AND ANAEROBIC Blood Culture results may not be optimal due to an inadequate volume of blood received in culture bottles   CULT  01/17/2023 0124    NO GROWTH 2 DAYS Performed  at [facility], [place] [address] [place], [place] [address]    CULT  01/17/2023 0124    NO GROWTH 2 DAYS Performed at [place] [address], [place],  [address]    REPTSTATUS PENDING 01/17/2023 0124   REPTSTATUS PENDING 01/17/2023 0124    Radiology Studies: No results found.   LOS: 2 days   Antonieta Pert, MD [facility]  01/19/2023, 1:37 PM

## 2023-01-19 NOTE — Progress Notes (Signed)
Nephrology Follow-Up Consult note   Assessment/Recommendations: Timothy Mcdowell is a/an 52 y.o. male with a past medical history significant for HTN, tobacco use disorder, admitted for cellulitis with sepsis.       AKI: Normal kidney function.  Creatinine initially 5 here now significantly improved down to 1.8 likely prerenal azotemia with some mild tubular injury.  Will sign off given improvement in kidney function  Sepsis/lower extremity cellulitis: IV antibiotics and treatment per primary team  Metabolic acidosis: Improving with improving kidney function.  No treatment  Hypertension: On amlodipine   Recommendations conveyed to primary service.    [facility] 01/19/2023 10:28 AM  ___________________________________________________________  CC: Sepsis  Interval History/Subjective: Patient still with some pain in his foot but otherwise denies any complaints.  Creatinine improving   Medications:  Current Facility-Administered Medications  Medication Dose Route Frequency Provider Last Rate Last Admin   acetaminophen (TYLENOL) tablet 650 mg  650 mg Oral Q6H PRN Howerter, Justin B, DO   650 mg at 01/17/23 2113   Or   acetaminophen (TYLENOL) suppository 650 mg  650 mg Rectal Q6H PRN Howerter, Justin B, DO       amLODipine (NORVASC) tablet 5 mg  5 mg Oral Daily Howerter, Justin B, DO   5 mg at 01/19/23 0928   ceFEPIme (MAXIPIME) 2 g in sodium chloride 0.9 % 100 mL IVPB  2 g Intravenous Q12H Dimple Nanas, RPH       heparin injection 5,000 Units  5,000 Units Subcutaneous Q8H Howerter, Justin B, DO   5,000 Units at 01/19/23 [id number]   HYDROcodone-acetaminophen (NORCO/VICODIN) 5-325 MG per tablet 1-2 tablet  1-2 tablet Oral Q4H PRN Roney Jaffe, MD   2 tablet at 01/19/23 0656   HYDROmorphone (DILAUDID) injection 0.5 mg  0.5 mg Intravenous Q2H PRN Howerter, Justin B, DO   0.5 mg at 01/18/23 0131   influenza vac split quadrivalent PF (FLUARIX)  injection 0.5 mL  0.5 mL Intramuscular Tomorrow-1000 Howerter, Justin B, DO       lactated ringers infusion   Intravenous Continuous Roney Jaffe, MD 125 mL/hr at 01/18/23 0420 Infusion Verify at 01/18/23 0420   linezolid (ZYVOX) IVPB 600 mg  600 mg Intravenous Q12H Antonieta Pert, MD 300 mL/hr at 01/19/23 0931 600 mg at 01/19/23 0931   melatonin tablet 3 mg  3 mg Oral QHS PRN Howerter, Justin B, DO       naloxone (NARCAN) injection 0.4 mg  0.4 mg Intravenous PRN Howerter, Justin B, DO       nicotine (NICODERM CQ - dosed in mg/24 hours) patch 14 mg  14 mg Transdermal Daily PRN Howerter, Justin B, DO       ondansetron (ZOFRAN) injection 4 mg  4 mg Intravenous Q6H PRN Howerter, Justin B, DO          Review of Systems: 10 systems reviewed and negative except per interval history/subjective  Physical Exam: Vitals:   01/19/23 0011 01/19/23 0749  BP: 137/78 135/75  Pulse: (!) 112 (!) 106  Resp: (!) 21   Temp: 98.2 F (36.8 C) 99.1 F (37.3 C)  SpO2: 100% 96%   No intake/output data recorded.  Intake/Output Summary (Last 24 hours) at 01/19/2023 1028 Last data filed at 01/19/2023 0300 Gross per 24 hour  Intake --  Output 1200 ml  Net -1200 ml   Constitutional: well-appearing, no acute distress ENMT: ears and nose without scars or lesions, MMM CV: normal rate, no edema Respiratory: clear to auscultation,  normal work of breathing Gastrointestinal: soft, non-tender, no palpable masses or hernias Skin: no visible lesions or rashes Psych: alert, judgement/insight appropriate, appropriate mood and affect   Test Results I personally reviewed new and old clinical labs and radiology tests Lab Results  Component Value Date   NA 137 01/19/2023   K 3.8 01/19/2023   CL 107 01/19/2023   CO2 20 (L) 01/19/2023   BUN 28 (H) 01/19/2023   CREATININE 1.81 (H) 01/19/2023   CALCIUM 8.3 (L) 01/19/2023   ALBUMIN 2.4 (L) 01/17/2023    CBC Recent Labs  Lab 01/16/23 2345 01/17/23 0422  01/18/23 0322 01/19/23 0344  WBC 26.7* 29.1* 28.9* 31.8*  NEUTROABS 22.8* 27.4*  --   --   HGB 10.6* 10.0* 9.4* 9.1*  HCT 31.5* 30.4* 26.3* 25.6*  MCV 57.2* 57.7* 54.6* 54.5*  PLT 224 195 230 255

## 2023-01-19 NOTE — TOC Initial Note (Signed)
Transition of Care [facility]) - Initial/Assessment Note    Patient Details  Name: Timothy Mcdowell MRN: [id number] Date of Birth: Jul 20, 1971  Transition of [facility]) CM/SW Contact:    Ninfa Meeker, RN Phone Number: 01/19/2023, 11:07 AM  Clinical Narrative:      Transition of Care Screening Note:             Patient is a  52 y.o. male,admitted for left foot cellulitis with sepsis.         Transition of Care [facility]) Department has reviewed patient and no TOC needs have been identified at this time. We will continue to monitor patient advancement through Interdisciplinary progressions and if new patient needs arise, please place a consult.   Patient Goals and CMS Choice            Expected Discharge Plan and Services                                              Prior Living Arrangements/Services                       Activities of Daily Living Home Assistive Devices/Equipment: Cane (specify quad or straight), Crutches ADL Screening (condition at time of admission) Patient's cognitive ability adequate to safely complete daily activities?: Yes Is the patient deaf or have difficulty hearing?: No Does the patient have difficulty seeing, even when wearing glasses/contacts?: No Does the patient have difficulty concentrating, remembering, or making decisions?: No Patient able to express need for assistance with ADLs?: No Does the patient have difficulty dressing or bathing?: Yes Independently performs ADLs?: No Communication: Independent Dressing (OT): Needs assistance Is this a change from baseline?: Pre-admission baseline Grooming: Needs assistance Is this a change from baseline?: Pre-admission baseline Feeding: Independent Bathing: Needs assistance Is this a change from baseline?: Pre-admission baseline Toileting: Needs assistance Is this a change from baseline?: Pre-admission baseline In/Out Bed: Needs assistance Is this a change from baseline?:  Pre-admission baseline Walks in Home: Needs assistance Is this a change from baseline?: Pre-admission baseline Does the patient have difficulty walking or climbing stairs?: Yes Weakness of Legs: Both Weakness of Arms/Hands: None  Permission Sought/Granted                  Emotional Assessment              Admission diagnosis:  Cellulitis of right foot [L03.115] Cellulitis of right lower extremity [L03.115] Acute renal failure, unspecified acute renal failure type ([place]) [N17.9] Excessive use of nonsteroidal anti-inflammatory drug (NSAID) [F19.90] Patient Active Problem List   Diagnosis Date Noted   Cellulitis of right foot 01/17/2023   Severe sepsis ([place]) 01/17/2023   AKI (acute kidney injury) ([place]) 01/17/2023   Acute anemia 01/17/2023   Essential hypertension 12/29/2022   Hospital discharge follow-up 12/29/2022   Tobacco abuse 12/29/2022   Cellulitis 12/13/2022   Tobacco abuse counseling 11/26/2022   Rash and other nonspecific skin eruption 11/26/2022   Itching 11/26/2022   Right foot pain 11/26/2022   Bilateral leg edema 11/26/2022   Encounter for examination following treatment at hospital 11/26/2022   Depression 11/26/2022   PCP:  Renee Rival, FNP Pharmacy:   [facility] [place], [place] - [address] AT [place] [address] Phone: [phone] Fax: [phone]  [facility] [address] [place] [address] Phone: [phone] Fax: [phone]     Social Determinants of Health (SDOH) Social History: SDOH Screenings   Food Insecurity: Food Insecurity Present (01/17/2023)  Housing: High Risk (01/17/2023)  Transportation Needs: Unmet Transportation Needs (01/17/2023)  Utilities: At Risk (01/17/2023)  Depression (PHQ2-9): High Risk (12/29/2022)  Tobacco Use: High Risk (01/17/2023)   SDOH Interventions: Food Insecurity Interventions:  Inpatient TOC Housing Interventions: Inpatient TOC Transportation Interventions: Inpatient TOC Utilities Interventions: Inpatient TOC   Readmission Risk Interventions     No data to display

## 2023-01-19 NOTE — Progress Notes (Signed)
PHARMACY NOTE:  ANTIMICROBIAL RENAL DOSAGE ADJUSTMENT  Current antimicrobial regimen includes a mismatch between antimicrobial dosage and estimated renal function.  As per policy approved by the Pharmacy & Therapeutics and Medical Executive Committees, the antimicrobial dosage will be adjusted accordingly.  Current antimicrobial dosage:  cefepime 2g IV q24h  Indication: cellulitis  Renal Function:  Estimated Creatinine Clearance: 49.3 mL/min (A) (by C-G formula based on SCr of 1.81 mg/dL (H)). []$      On intermittent HD, scheduled: []$      On CRRT    Antimicrobial dosage has been changed to:  cefepime 2g IV q12h  Additional comments:   Thank you for allowing pharmacy to be a part of this patient's care.  Dimple Nanas, PharmD, BCPS 01/19/2023 7:39 AM

## 2023-01-19 NOTE — Consult Note (Signed)
Reason for Consult:Right foot cellulitis  Referring Physician: Antonieta Pert Time called: 1350 Time at bedside: 1402   Timothy Mcdowell is an 52 y.o. male.  HPI: [name] stepped on a nail with his right foot mid-December. Since then he has had problems with pain, swelling, and sores, He was seen in the ED in December and placed on oral abx which he says did nothing. He returned in early January and was admitted. He says things were improving but worsened again after he was sent home and he presented again 2d ago. Orthopedic surgery was consulted today for opinion. He denies prior hx/o foot problems and works in [employer].  Past Medical History:  Diagnosis Date   Hypertension     History reviewed. No pertinent surgical history.  Family History  Problem Relation Age of Onset   Hypertension Mother     Social History:  reports that he has been smoking. He has never used smokeless tobacco. He reports current drug use. Drug: Marijuana. He reports that he does not drink alcohol.  Allergies:  Allergies  Allergen Reactions   Other Rash    strawberries    Medications: I have reviewed the patient's current medications.  Results for orders placed or performed during the hospital encounter of 01/16/23 (from the past 48 hour(s))  CBC     Status: Abnormal   Collection Time: 01/18/23  3:22 AM  Result Value Ref Range   WBC 28.9 (H) 4.0 - 10.5 K/uL   RBC 4.82 4.22 - 5.81 MIL/uL   Hemoglobin 9.4 (L) 13.0 - 17.0 g/dL   HCT 26.3 (L) 39.0 - 52.0 %   MCV 54.6 (L) 80.0 - 100.0 fL   MCH 19.5 (L) 26.0 - 34.0 pg   MCHC 35.7 30.0 - 36.0 g/dL   RDW 17.7 (H) 11.5 - 15.5 %   Platelets 230 150 - 400 K/uL    Comment: REPEATED TO VERIFY   nRBC 0.1 0.0 - 0.2 %    Comment: Performed at [facility], [place] [address] [place], [place] [id number]  Basic metabolic panel     Status: Abnormal   Collection Time: 01/18/23  4:57 AM  Result Value Ref Range   Sodium 138 135 - 145 mmol/L   Potassium 3.8 3.5 - 5.1  mmol/L   Chloride 108 98 - 111 mmol/L   CO2 18 (L) 22 - 32 mmol/L   Glucose, Bld 89 70 - 99 mg/dL    Comment: Glucose reference range applies only to samples taken after fasting for at least 8 hours.   BUN 40 (H) 6 - 20 mg/dL   Creatinine, Ser 2.85 (H) 0.61 - 1.24 mg/dL   Calcium 8.4 (L) 8.9 - 10.3 mg/dL   GFR, Estimated 26 (L) >60 mL/min    Comment: (NOTE) Calculated using the CKD-EPI Creatinine Equation (2021)    Anion gap 12 5 - 15    Comment: Performed at [facility] [address] [place], [place] [id number]  Basic metabolic panel     Status: Abnormal   Collection Time: 01/19/23  3:44 AM  Result Value Ref Range   Sodium 137 135 - 145 mmol/L   Potassium 3.8 3.5 - 5.1 mmol/L   Chloride 107 98 - 111 mmol/L   CO2 20 (L) 22 - 32 mmol/L   Glucose, Bld 153 (H) 70 - 99 mg/dL    Comment: Glucose reference range applies only to samples taken after fasting for at least 8 hours.   BUN  28 (H) 6 - 20 mg/dL   Creatinine, Ser 1.81 (H) 0.61 - 1.24 mg/dL   Calcium 8.3 (L) 8.9 - 10.3 mg/dL   GFR, Estimated 44 (L) >60 mL/min    Comment: (NOTE) Calculated using the CKD-EPI Creatinine Equation (2021)    Anion gap 10 5 - 15    Comment: Performed at [facility] [address], [place], [place] [address]  CBC     Status: Abnormal   Collection Time: 01/19/23  3:44 AM  Result Value Ref Range   WBC 31.8 (H) 4.0 - 10.5 K/uL   RBC 4.70 4.22 - 5.81 MIL/uL   Hemoglobin 9.1 (L) 13.0 - 17.0 g/dL   HCT 25.6 (L) 39.0 - 52.0 %   MCV 54.5 (L) 80.0 - 100.0 fL   MCH 19.4 (L) 26.0 - 34.0 pg   MCHC 35.5 30.0 - 36.0 g/dL   RDW 17.1 (H) 11.5 - 15.5 %   Platelets 255 150 - 400 K/uL    Comment: REPEATED TO VERIFY   nRBC 0.0 0.0 - 0.2 %    Comment: Performed at [facility], [place] [address] [place], [place] [address]    No results found.  Review of Systems  Constitutional:  Negative for chills, diaphoresis and fever.  HENT:  Negative for ear discharge, ear pain, hearing loss and  tinnitus.   Eyes:  Negative for photophobia and pain.  Respiratory:  Negative for cough and shortness of breath.   Cardiovascular:  Negative for chest pain.  Gastrointestinal:  Negative for abdominal pain, nausea and vomiting.  Genitourinary:  Negative for dysuria, flank pain, frequency and urgency.  Musculoskeletal:  Positive for arthralgias (Bilaterl feet, R>L). Negative for back pain, myalgias and neck pain.  Neurological:  Negative for dizziness and headaches.  Hematological:  Does not bruise/bleed easily.  Psychiatric/Behavioral:  The patient is not nervous/anxious.    Blood pressure 135/75, pulse (!) 106, temperature 99.1 F (37.3 C), temperature source Oral, resp. rate (!) 21, height 5' 10"$  (1.778 m), weight 83.2 kg, SpO2 96 %. Physical Exam Constitutional:      General: He is not in acute distress.    Appearance: He is well-developed. He is not diaphoretic.  HENT:     Head: Normocephalic and atraumatic.  Eyes:     General: No scleral icterus.       Right eye: No discharge.        Left eye: No discharge.     Conjunctiva/sclera: Conjunctivae normal.  Cardiovascular:     Rate and Rhythm: Normal rate and regular rhythm.  Pulmonary:     Effort: Pulmonary effort is normal. No respiratory distress.  Musculoskeletal:     Cervical back: Normal range of motion.  Feet:     Comments: Right foot: Lichenifed changes to dorsum of foot, mod TTP light tough on dorsum of foot, scant DP, 0 PT, SPN/DPN/TN intact, EHL 5/5, 2+ generalized NP edema  Left foot: Discoloration dorsum of foot, mild TTP light touch dorsum, DP 0, PT 0, SPN/DPN/TN intact, EHL 5.5 1+ NP edema Skin:    General: Skin is warm and dry.  Neurological:     Mental Status: He is alert.  Psychiatric:        Mood and Affect: Mood normal.        Behavior: Behavior normal.     Assessment/Plan: Right foot cellulitis -- Will check ABI's to see if some arterial insufficiency may be at play. Dr. Sharol Given to evaluate later today  or  in AM. I do not see a surgical indication at this time.    Lisette Abu, PA-C Orthopedic Surgery [phone] 01/19/2023, 2:14 PM

## 2023-01-20 ENCOUNTER — Inpatient Hospital Stay (HOSPITAL_COMMUNITY): Payer: Medicaid Other

## 2023-01-20 ENCOUNTER — Encounter (HOSPITAL_COMMUNITY): Payer: Medicaid Other

## 2023-01-20 DIAGNOSIS — L03115 Cellulitis of right lower limb: Secondary | ICD-10-CM | POA: Diagnosis not present

## 2023-01-20 LAB — BASIC METABOLIC PANEL
Anion gap: 9 (ref 5–15)
BUN: 16 mg/dL (ref 6–20)
CO2: 21 mmol/L — ABNORMAL LOW (ref 22–32)
Calcium: 8.5 mg/dL — ABNORMAL LOW (ref 8.9–10.3)
Chloride: 107 mmol/L (ref 98–111)
Creatinine, Ser: 1.3 mg/dL — ABNORMAL HIGH (ref 0.61–1.24)
GFR, Estimated: >60 mL/min (ref >60)
Glucose, Bld: 98 mg/dL (ref 70–99)
Potassium: 3.9 mmol/L (ref 3.5–5.1)
Sodium: 137 mmol/L (ref 135–145)

## 2023-01-20 LAB — CBC
HCT: 27.5 % — ABNORMAL LOW (ref 39.0–52.0)
Hemoglobin: 9.4 g/dL — ABNORMAL LOW (ref 13.0–17.0)
MCH: 18.8 pg — ABNORMAL LOW (ref 26.0–34.0)
MCHC: 34.2 g/dL (ref 30.0–36.0)
MCV: 55.1 fL — ABNORMAL LOW (ref 80.0–100.0)
Platelets: 277 10*3/uL (ref 150–400)
RBC: 4.99 MIL/uL (ref 4.22–5.81)
RDW: 17.3 % — ABNORMAL HIGH (ref 11.5–15.5)
WBC: 29.3 10*3/uL — ABNORMAL HIGH (ref 4.0–10.5)
nRBC: 0 % (ref 0.0–0.2)

## 2023-01-20 MED ORDER — SODIUM CHLORIDE 0.9 % IV SOLN
2.0000 g | Freq: Three times a day (TID) | INTRAVENOUS | Status: DC
  Administered 2023-01-20 – 2023-01-21 (×2): 2 g via INTRAVENOUS
  Filled 2023-01-20 (×3): qty 12.5

## 2023-01-20 NOTE — Progress Notes (Signed)
PHARMACY NOTE:  ANTIMICROBIAL RENAL DOSAGE ADJUSTMENT  Current antimicrobial regimen includes a mismatch between antimicrobial dosage and estimated renal function.  As per policy approved by the Pharmacy & Therapeutics and Medical Executive Committees, the antimicrobial dosage will be adjusted accordingly.  Current antimicrobial dosage:  cefepime 2g VI q12h  Indication: cellulitis  Renal Function:  Estimated Creatinine Clearance: 68.6 mL/min (A) (by C-G formula based on SCr of 1.3 mg/dL (H)). []$      On intermittent HD, scheduled: []$      On CRRT    Antimicrobial dosage has been changed to:  cefepime 2g IV q8h  Additional comments:   Thank you for allowing pharmacy to be a part of this patient's care.   Dimple Nanas, PharmD, BCPS 01/20/2023 11:04 AM

## 2023-01-20 NOTE — Progress Notes (Signed)
Mobility Specialist Progress Note    01/20/23 1115  Mobility  Activity Transferred from bed to chair  Level of Assistance Contact guard assist, steadying assist  Assistive Device Other (Comment) (HHA)  Distance Ambulated (ft) 2 ft  Activity Response Tolerated well  Mobility Referral Yes  $Mobility charge 1 Mobility   Pre-Mobility: 106 HR, 96% SpO2 During Mobility: 117 HR Post-Mobility: 109 HR, 96% SpO2  Pt received and agreeable. C/o pain. Left with call bell in reach. RN aware.   Hildred Alamin Mobility Specialist  Please Psychologist, sport and exercise or Rehab Office at [phone]

## 2023-01-20 NOTE — Progress Notes (Signed)
PROGRESS NOTE Timothy Mcdowell  [id number] DOB: December 21, 1970 DOA: 01/16/2023 PCP: Renee Rival, FNP   Brief Narrative/Hospital Course: 52 y.o. male with medical history significant for essential hypertension, chronic tobacco abuse presented with worsening swelling and pain on his right lower leg, was recently admitted in January for same and was treated with IV antibiotics and discharged home on oral antibiotics but for the past 2 weeks patient noticing worsening of the infection.  He was initially not taking ibuprofen or Keflex as he did not have any money however has since picked up the medication as has been taking for past several days without much relief. In the ED vital signs stable BP tachycardia afebrile labs with leukocytosis metabolic acidosis AKI. EKG sinus rhythm nonspecific T wave changes, chest x-ray no evidence of acute cardiopulmonary process, patient was admitted for further evaluation of sepsis with secondary cellulitis and AKI and anemia.    Subjective: Patient seen and examined this morning no new complaints still having pain Overnight afebrile, normotensive, having mild tachycardia Labs shows improved creatinine 1.3 still persistent leukocytosis but slightly better  Assessment and Plan: Principal Problem:   Cellulitis of right foot Active Problems:   Essential hypertension   Tobacco abuse   Severe sepsis (HCC)   AKI (acute kidney injury) ([place])   Acute anemia  Right lower extremity cellulitis with associated severe sepsis POA Recent admission for right lower extremity first week of January: His MRI from 1/6 right foot showed cellulitis, no evidence of septic arthritis or osteomyelitis.  X-ray-severe soft tissue edema w/o soft tissue gas or foreign body or osseous changes. Continues to have pain, and persistent leukocytosis appreciate input from orthopedics, vascular ABI ordered, along with MRI but Dr Sharol Given to evaluate for- continue with cefepime, Zyvox. Cont pain  control.  Appreciate orthopedics input Recent Labs  Lab 01/16/23 2345 01/17/23 0124 01/17/23 0422 01/18/23 0322 01/19/23 0344 01/20/23 0155  WBC 26.7*  --  29.1* 28.9* 31.8* 29.3*  LATICACIDVEN 1.2 1.0  --   --   --   --    [id number] in the setting of dehydration lisinopril ibuprofen use and also with infection.  Significantly improved with IV fluids, appreciate nephrology input. Recent Labs    11/19/22 0101 12/12/22 1834 12/13/22 0816 12/14/22 0205 12/29/22 1412 01/16/23 2345 01/17/23 0422 01/18/23 0457 01/19/23 0344 01/20/23 0155  BUN 21* 13 10 15 14 $ 57* 54* 40* 28* 16  CREATININE 0.98 1.09 1.10 1.02 0.89 5.15* 4.96* 2.85* 1.81* 123456*    Metabolic acidosis: In the setting of AKI, monitor, continue hydration.  Bicarb improving  Anemia microcytic: Baseline hb ~12g, holding now in 9-10 gm. Anemia panel shows elevated ferritin iron low. Add Po iron on dc. Recent Labs    12/13/22 0816 12/14/22 0205 01/16/23 2345 01/17/23 0422 01/18/23 0322 01/19/23 0344 01/20/23 0155  HGB 12.4*   < > 10.6* 10.0* 9.4* 9.1* 9.4*  MCV 59.6*   < > 57.2* 57.7* 54.6* 54.5* 55.1*  FERRITIN 208  --   --  1,051*  --   --   --   TIBC  --   --   --  179*  --   --   --   IRON  --   --   --  23*  --   --   --   RETICCTPCT  --   --   --  0.6  --   --   --    < > = values in this interval not displayed.  Essential hypertension: BP well-controlled ,  mildly tachycardic, on amlodipine Chronic tobacco abuse: counseled  DVT prophylaxis: heparin injection 5,000 Units Start: 01/17/23 0600 Code Status:   Code Status: Full Code  Family Communication: plan of care discussed with patient at bedside. Patient status is: Inpatient because of foot infection and AKI Level of care: Telemetry Medical   Dispo: The patient is from: Home            Anticipated disposition: Home  TBD Objective: Vitals last 24 hrs: Vitals:   01/19/23 0749 01/19/23 1534 01/19/23 1949 01/20/23 0845  BP: 135/75 (!)  168/91 (!) 148/78 (!) 146/85  Pulse: (!) 106 (!) 116 (!) 109 (!) 105  Resp:   18   Temp: 99.1 F (37.3 C)  98.9 F (37.2 C) (!) 97.5 F (36.4 C)  TempSrc: Oral  Oral Oral  SpO2: 96% 96% 90% 91%  Weight:      Height:       Weight change:   Physical Examination: General exam: AAox3, weak,older appearing HEENT:Oral mucosa moist, Ear/Nose WNL grossly, dentition normal. Respiratory system: bilaterally clear BS, no use of accessory muscle Cardiovascular system: S1 & S2 +, regular rate. Gastrointestinal system: Abdomen soft, NT,ND,BS+ Nervous System:Alert, awake, moving extremities and grossly nonfocal Extremities: LE ankle edema + on feet- with hyperkeratotic, lichenoid skin lesions on the foot see below Skin: No rashes,no icterus. MSK: Normal muscle bulk,tone, power    Medications reviewed:  Scheduled Meds:  amLODipine  5 mg Oral Daily   heparin  5,000 Units Subcutaneous Q8H   influenza vac split quadrivalent PF  0.5 mL Intramuscular Tomorrow-1000   Continuous Infusions:  ceFEPime (MAXIPIME) IV     lactated ringers 125 mL/hr at 01/18/23 0420   linezolid (ZYVOX) IV 600 mg (01/20/23 1100)    Diet Order             Diet regular Room service appropriate? Yes; Fluid consistency: Thin  Diet effective now                  Intake/Output Summary (Last 24 hours) at 01/20/2023 1111 Last data filed at 01/19/2023 2000 Gross per 24 hour  Intake --  Output 400 ml  Net -400 ml   Net IO Since Admission: -212.24 mL [01/20/23 1111]  Wt Readings from Last 3 Encounters:  01/19/23 83.2 kg  12/29/22 79.5 kg  12/16/22 78 kg     Unresulted Labs (From admission, onward)     Start     Ordered   01/18/23 [id number]  Basic metabolic panel  Daily,   R     Question:  Specimen collection method  Answer:  Lab=Lab collect   01/17/23 0738   01/18/23 0500  CBC  Daily,   R     Question:  Specimen collection method  Answer:  Lab=Lab collect   01/17/23 0738   01/17/23 1447  MRSA Next Gen by PCR,  Nasal  ONCE - URGENT,   URGENT        01/17/23 1446          Data Reviewed: I have personally reviewed following labs and imaging studies CBC: Recent Labs  Lab 01/16/23 2345 01/17/23 0422 01/18/23 0322 01/19/23 0344 01/20/23 0155  WBC 26.7* 29.1* 28.9* 31.8* 29.3*  NEUTROABS 22.8* 27.4*  --   --   --   HGB 10.6* 10.0* 9.4* 9.1* 9.4*  HCT 31.5* 30.4* 26.3* 25.6* 27.5*  MCV 57.2* 57.7* 54.6* 54.5* 55.1*  PLT 224 195 230 255 277  Basic Metabolic Panel: Recent Labs  Lab 01/16/23 2345 01/17/23 0124 01/17/23 0422 01/18/23 0457 01/19/23 0344 01/20/23 0155  NA 134*  --  136 138 137 137  K 3.8  --  3.7 3.8 3.8 3.9  CL 101  --  103 108 107 107  CO2 20*  --  17* 18* 20* 21*  GLUCOSE 137*  --  107* 89 153* 98  BUN 57*  --  54* 40* 28* 16  CREATININE 5.15*  --  4.96* 2.85* 1.81* 1.30*  CALCIUM 8.6*  --  8.1* 8.4* 8.3* 8.5*  MG  --  2.3 2.3  --   --   --    GFR: Estimated Creatinine Clearance: 68.6 mL/min (A) (by C-G formula based on SCr of 1.3 mg/dL (H)). Liver Function Tests: Recent Labs  Lab 01/16/23 2345 01/17/23 0422  AST 23 23  ALT 23 22  ALKPHOS 87 100  BILITOT 0.7 1.2  PROT 7.0 6.4*  ALBUMIN 2.6* 2.4*   Recent Labs  Lab 01/17/23 0422  INR 1.4*   Recent Labs  Lab 01/16/23 2345 01/17/23 0124  LATICACIDVEN 1.2 1.0    Recent Results (from the past 240 hour(s))  Blood culture (routine x 2)     Status: None (Preliminary result)   Collection Time: 01/17/23  1:24 AM   Specimen: BLOOD RIGHT ARM  Result Value Ref Range Status   Specimen Description BLOOD RIGHT ARM  Final   Special Requests   Final    BOTTLES DRAWN AEROBIC AND ANAEROBIC Blood Culture results may not be optimal due to an inadequate volume of blood received in culture bottles   Culture   Final    NO GROWTH 3 DAYS Performed at [facility], [place] [address] [place], [place] [address]    Report Status PENDING  Incomplete  Blood culture (routine x 2)     Status: None (Preliminary  result)   Collection Time: 01/17/23  1:24 AM   Specimen: BLOOD LEFT ARM  Result Value Ref Range Status   Specimen Description BLOOD LEFT ARM  Final   Special Requests   Final    BOTTLES DRAWN AEROBIC AND ANAEROBIC Blood Culture results may not be optimal due to an inadequate volume of blood received in culture bottles   Culture   Final    NO GROWTH 3 DAYS Performed at [facility], [place] [address], [place], [place] [address]    Report Status PENDING  Incomplete    Antimicrobials: Anti-infectives (From admission, onward)    Start     Dose/Rate Route Frequency Ordered Stop   01/20/23 1819  ceFEPIme (MAXIPIME) 2 g in sodium chloride 0.9 % 100 mL IVPB        2 g 200 mL/hr over 30 Minutes Intravenous Every 8 hours 01/20/23 1104     01/19/23 1000  ceFEPIme (MAXIPIME) 2 g in sodium chloride 0.9 % 100 mL IVPB  Status:  Discontinued        2 g 200 mL/hr over 30 Minutes Intravenous Every 12 hours 01/19/23 0738 01/20/23 1104   01/17/23 2200  ceFEPIme (MAXIPIME) 2 g in sodium chloride 0.9 % 100 mL IVPB  Status:  Discontinued        2 g 200 mL/hr over 30 Minutes Intravenous Every 24 hours 01/17/23 0245 01/19/23 0738   01/17/23 1600  linezolid (ZYVOX) IVPB 600 mg        600 mg 300 mL/hr over 60 Minutes Intravenous Every 12 hours 01/17/23  1507     01/17/23 0245  vancomycin variable dose per unstable renal function (pharmacist dosing)  Status:  Discontinued         Does not apply See admin instructions 01/17/23 0245 01/17/23 1507   01/17/23 0100  vancomycin (VANCOREADY) IVPB 1500 mg/300 mL        1,500 mg 150 mL/hr over 120 Minutes Intravenous  Once 01/17/23 0057 01/17/23 0354   01/17/23 0100  ceFEPIme (MAXIPIME) 2 g in sodium chloride 0.9 % 100 mL IVPB        2 g 200 mL/hr over 30 Minutes Intravenous  Once 01/17/23 0057 01/17/23 0256      Culture/Microbiology    Component Value Date/Time   SDES BLOOD RIGHT ARM 01/17/2023 0124   SDES BLOOD LEFT ARM 01/17/2023 0124   SPECREQUEST   01/17/2023 0124    BOTTLES DRAWN AEROBIC AND ANAEROBIC Blood Culture results may not be optimal due to an inadequate volume of blood received in culture bottles   SPECREQUEST  01/17/2023 0124    BOTTLES DRAWN AEROBIC AND ANAEROBIC Blood Culture results may not be optimal due to an inadequate volume of blood received in culture bottles   CULT  01/17/2023 0124    NO GROWTH 3 DAYS Performed at [facility], [place] [address], [place], [place] [address]    CULT  01/17/2023 0124    NO GROWTH 3 DAYS Performed at [facility] [address], [place], [place] [address]    REPTSTATUS PENDING 01/17/2023 0124   REPTSTATUS PENDING 01/17/2023 0124    Radiology Studies: No results found.   LOS: 3 days   Antonieta Pert, MD [facility]  01/20/2023, 11:11 AM

## 2023-01-20 NOTE — Consult Note (Signed)
ORTHOPAEDIC CONSULTATION  REQUESTING PHYSICIAN: Antonieta Pert, MD  Chief Complaint: Pain in both feet including right leg. HPI: Timothy Mcdowell is a 52 y.o. male who presents with  Patient is a 52 year old gentleman who is seen for initial evaluation for the right lower extremity.  Patient has a complex history where he states he stepped on a nail in mid December.  He was seen in the emergency department in December starting oral antibiotics and he returned in January with worsening symptoms he was discharged on antibiotics and he states that the antibiotics did not resolve his symptoms.  Past Medical History:  Diagnosis Date   Hypertension    History reviewed. No pertinent surgical history. Social History   Socioeconomic History   Marital status: Single    Spouse name: Not on file   Number of children: 1   Years of education: Not on file   Highest education level: Not on file  Occupational History   Not on file  Tobacco Use   Smoking status: Every Day   Smokeless tobacco: Never   Tobacco comments:    A pack last every 2 days , age 52 .   Substance and Sexual Activity   Alcohol use: No   Drug use: Yes    Types: Marijuana   Sexual activity: Not on file  Other Topics Concern   Not on file  Social History Narrative   Lives with a friend   Social Determinants of Health   Financial Resource Strain: Not on file  Food Insecurity: Food Insecurity Present (01/17/2023)   Hunger Vital Sign    Worried About Running Out of Food in the Last Year: Often true    Ran Out of Food in the Last Year: Often true  Transportation Needs: Unmet Transportation Needs (01/17/2023)   PRAPARE - Hydrologist (Medical): Yes    Lack of Transportation (Non-Medical): Yes  Physical Activity: Not on file  Stress: Not on file  Social Connections: Not on file   Family History  Problem Relation Age of Onset   Hypertension Mother    - negative except otherwise stated in the  family history section Allergies  Allergen Reactions   Other Rash    strawberries   Prior to Admission medications   Medication Sig Start Date End Date Taking? Authorizing Provider  acetaminophen (TYLENOL) 500 MG tablet Take 500 mg by mouth every 6 (six) hours as needed for moderate pain.   Yes [provider]  amLODipine (NORVASC) 5 MG tablet Take 1 tablet (5 mg total) by mouth daily. 12/29/22  Yes Paseda, Dewaine Conger, FNP  hydrOXYzine (VISTARIL) 25 MG capsule Take 1 capsule (25 mg total) by mouth every 8 (eight) hours as needed for itching. 12/29/22  Yes Paseda, Dewaine Conger, FNP  ibuprofen (ADVIL) 600 MG tablet Take 1 tablet (600 mg total) by mouth every 8 (eight) hours as needed. 12/29/22  Yes Paseda, Dewaine Conger, FNP  lisinopril (ZESTRIL) 20 MG tablet Take 1 tablet (20 mg total) by mouth daily. 12/29/22  Yes Paseda, Dewaine Conger, FNP  triamcinolone cream (KENALOG) 0.1 % Apply 1 Application topically 2 (two) times daily. 12/29/22  Yes Paseda, Dewaine Conger, FNP   No results found. - pertinent xrays, CT, MRI studies were reviewed and independently interpreted  Positive ROS: All other systems have been reviewed and were otherwise negative with the exception of those mentioned in the HPI and as above.  Physical Exam: General: Alert, no acute distress Psychiatric:  Patient is competent for consent with normal mood and affect Lymphatic: No axillary or cervical lymphadenopathy Cardiovascular: No pedal edema Respiratory: No cyanosis, no use of accessory musculature GI: No organomegaly, abdomen is soft and non-tender    Images:  @ENCIMAGES$ @  Labs:  Lab Results  Component Value Date   HGBA1C 5.4 12/13/2022   HGBA1C 5.1 11/26/2022   CRP 1.5 (H) 12/13/2022   REPTSTATUS PENDING 01/17/2023   REPTSTATUS PENDING 01/17/2023   CULT  01/17/2023    NO GROWTH 2 DAYS Performed at [facility], [place] [address], [place], [place] [address]    CULT  01/17/2023    NO GROWTH 2  DAYS Performed at [facility] [address] [place], [place] [address]     Lab Results  Component Value Date   ALBUMIN 2.4 (L) 01/17/2023   ALBUMIN 2.6 (L) 01/16/2023   ALBUMIN 3.1 (L) 12/14/2022        Latest Ref Rng & Units 01/20/2023    1:55 AM 01/19/2023    3:44 AM 01/18/2023    3:22 AM  CBC EXTENDED  WBC 4.0 - 10.5 K/uL 29.3  31.8  28.9   RBC 4.22 - 5.81 MIL/uL 4.99  4.70  4.82   Hemoglobin 13.0 - 17.0 g/dL 9.4  9.1  9.4   HCT 39.0 - 52.0 % 27.5  25.6  26.3   Platelets 150 - 400 K/uL 277  255  230     Neurologic: Patient does not have protective sensation bilateral lower extremities.   MUSCULOSKELETAL:   Skin: Examination patient has cellulitis and swelling of the right leg.  He has venous insufficiency with brawny skin changes from venous and lymphatic insufficiency.  Patient has a strongly palpable dorsalis pedis pulse bilaterally.  Patient has a resolved rash from antibiotics that he was treated with 2 weeks ago involving the upper and lower extremity rashes.  Patient has pain to palpation around both feet and right leg.  There are no ulcers or cellulitis on the plantar aspect of the right foot where he is reportedly stepped on a nail.  White cell count 29.3 was as high as 31.8.  Hemoglobin stable at 9.4.  Assessment: Assessment: Infection right lower extremity possibly secondary to venous insufficiency.  No clinical signs of an abscess on the plantar aspect of his foot.  Plan: Plan: Patient is currently on [place] and Zyvox.  I will place an order for MRI scan of the right leg and right foot to evaluate for deep abscess.  I have canceled the ABI study.  Patient has strong palpable pulses.  Thank you for the consult and the opportunity to see Mr. Timothy Mcdowell, [place] [phone] 10:21 AM

## 2023-01-21 DIAGNOSIS — L03115 Cellulitis of right lower limb: Secondary | ICD-10-CM | POA: Diagnosis not present

## 2023-01-21 LAB — CBC
HCT: 25.3 % — ABNORMAL LOW (ref 39.0–52.0)
Hemoglobin: 8.6 g/dL — ABNORMAL LOW (ref 13.0–17.0)
MCH: 18.7 pg — ABNORMAL LOW (ref 26.0–34.0)
MCHC: 34 g/dL (ref 30.0–36.0)
MCV: 55.1 fL — ABNORMAL LOW (ref 80.0–100.0)
Platelets: 262 10*3/uL (ref 150–400)
RBC: 4.59 MIL/uL (ref 4.22–5.81)
RDW: 16.9 % — ABNORMAL HIGH (ref 11.5–15.5)
WBC: 23.3 10*3/uL — ABNORMAL HIGH (ref 4.0–10.5)
nRBC: 0.1 % (ref 0.0–0.2)

## 2023-01-21 LAB — BASIC METABOLIC PANEL
Anion gap: 8 (ref 5–15)
BUN: 12 mg/dL (ref 6–20)
CO2: 22 mmol/L (ref 22–32)
Calcium: 8 mg/dL — ABNORMAL LOW (ref 8.9–10.3)
Chloride: 104 mmol/L (ref 98–111)
Creatinine, Ser: 1.15 mg/dL (ref 0.61–1.24)
GFR, Estimated: >60 mL/min (ref >60)
Glucose, Bld: 110 mg/dL — ABNORMAL HIGH (ref 70–99)
Potassium: 4 mmol/L (ref 3.5–5.1)
Sodium: 134 mmol/L — ABNORMAL LOW (ref 135–145)

## 2023-01-21 MED ORDER — CEFAZOLIN SODIUM-DEXTROSE 2-4 GM/100ML-% IV SOLN
2.0000 g | Freq: Three times a day (TID) | INTRAVENOUS | Status: AC
  Administered 2023-01-21 – 2023-01-23 (×8): 2 g via INTRAVENOUS
  Filled 2023-01-21 (×8): qty 100

## 2023-01-21 NOTE — Progress Notes (Signed)
Patient ID: Timothy Mcdowell, male   DOB: 02-May-1971, 52 y.o.   MRN: [id number] Patient's right leg cellulitis is improving the skin is now wrinkling the swelling has decreased.  Patient can now wiggle the toes on both feet.  The MRI scan does not show any abscess or osteomyelitis.  Anticipate continuing the IV antibiotics for a few more days.  Patient is extremely concerned about being discharged too soon.  He has been discharged twice and has had recurrent recurrent infection.

## 2023-01-21 NOTE — Progress Notes (Signed)
PROGRESS NOTE Timothy Mcdowell  [id number] DOB: 1971/08/23 DOA: 01/16/2023 PCP: Renee Rival, FNP   Brief Narrative/Hospital Course: 52 y.o. male with medical history significant for essential hypertension, chronic tobacco abuse presented with worsening swelling and pain on his right lower leg, was recently admitted in January for same and was treated with IV antibiotics and discharged home on oral antibiotics but for the past 2 weeks patient noticing worsening of the infection.  He was initially not taking ibuprofen or Keflex as he did not have any money however has since picked up the medication as has been taking for past several days without much relief. In the ED vital signs stable BP tachycardia afebrile labs with leukocytosis metabolic acidosis AKI. EKG sinus rhythm nonspecific T wave changes, chest x-ray no evidence of acute cardiopulmonary process, patient was admitted for further evaluation of sepsis with secondary cellulitis and AKI and anemia. His significant AKI nicely improved with IV fluids and minimizing nephrotoxic medications. Dr. Sharol Given has seen the patient for his foot infection    Subjective:  Seen and examined this morning.  Resting comfortably Overnight afebrile Normotensive mildly tachycardic but improving He walked 2 feet with mobility yesterday Labs showed improved creatinine, WBC count downtrending at 23 from 29k. Still having pain on the foot, overall slowly improving  Assessment and Plan: Principal Problem:   Cellulitis of right foot Active Problems:   Essential hypertension   Tobacco abuse   Severe sepsis (HCC)   AKI (acute kidney injury) ([place])   Acute anemia  Right lower extremity cellulitis with associated severe sepsis POA Recent admission for right lower extremity first week of January-where MRI was negative for osteo myelitis Injury w/ stepped on a nail in the mid December: X-ray in ed-severe soft tissue edema w/o soft tissue gas or foreign  body or osseous changes.  Patient has been having pain and difficulty with tibia-fibula : Finding of cellulitis, no drainable fluid collection or abscess or evidence of osteomyelitis.  Trace amount of fluid in the muscles area likely reactive due to cellulitis.  Cont  Zyvox and Zosyn, pain management. Continue plan as per orthopedics Recent Labs  Lab 01/16/23 2345 01/17/23 0124 01/17/23 0422 01/18/23 0322 01/19/23 0344 01/20/23 0155 01/21/23 0331  WBC 26.7*  --  29.1* 28.9* 31.8* 29.3* 23.3*  LATICACIDVEN 1.2 1.0  --   --   --   --   --    AKI:2/2 dehydration lisinopril ibuprofen use and also with infection.  Resolved.  Cut down on IV fluids Recent Labs    12/12/22 1834 12/13/22 0816 12/14/22 0205 12/29/22 1412 01/16/23 2345 01/17/23 0422 01/18/23 0457 01/19/23 0344 01/20/23 0155 01/21/23 0331  BUN 13 10 15 14 $ 57* 54* 40* 28* 16 12  CREATININE 1.09 1.10 1.02 0.89 5.15* 4.96* 2.85* 1.81* [id number]* [id number]    Metabolic acidosis: In the setting of AKI, resolved Anemia microcytic: Baseline hb ~12g, holding 8--10 gm. Anemia panel shows elevated ferritin iron low. Add Po iron on dc. Recent Labs    12/13/22 0816 12/14/22 0205 01/17/23 0422 01/18/23 0322 01/19/23 0344 01/20/23 0155 01/21/23 0331  HGB 12.4*   < > 10.0* 9.4* 9.1* 9.4* 8.6*  MCV 59.6*   < > 57.7* 54.6* 54.5* 55.1* 55.1*  FERRITIN 208  --  1,051*  --   --   --   --   TIBC  --   --  179*  --   --   --   --   IRON  --   --  23*  --   --   --   --   RETICCTPCT  --   --  0.6  --   --   --   --    < > = values in this interval not displayed.     Essential hypertension: BP stable. on amlodipine Sinus tachycardia in the setting of dehydration/pain foot infection, improving. Chronic tobacco abuse: counseled  DVT prophylaxis: heparin injection 5,000 Units Start: 01/17/23 0600 Code Status:   Code Status: Full Code  Family Communication: plan of care discussed with patient at bedside. Patient status is: Inpatient because of  foot infection and AKI Level of care: Telemetry Medical   Dispo: The patient is from: Home            Anticipated disposition: Home  TBD Objective: Vitals last 24 hrs: Vitals:   01/20/23 0845 01/20/23 1258 01/20/23 2000 01/21/23 0745  BP: (!) 146/85 (!) 148/79 (!) 149/82 (!) 140/78  Pulse: (!) 105 (!) 103  99  Resp:  18  18  Temp: (!) 97.5 F (36.4 C) 98.8 F (37.1 C) 98 F (36.7 C) 99.4 F (37.4 C)  TempSrc: Oral Oral Oral Oral  SpO2: 91% 98% 97% 97%  Weight:      Height:       Weight change:   Physical Examination: General exam: AAox3, weak,older appearing HEENT:Oral mucosa moist, Ear/Nose WNL grossly, dentition normal. Respiratory system: bilaterally clear BS, no use of accessory muscle Cardiovascular system: S1 & S2 +, regular rate,. Gastrointestinal system: Abdomen soft, NT,ND,BS+ Nervous System:Alert, awake, moving extremities and grossly nonfocal Extremities: LE ankle edema + w/ lichenoid skin discoloration and tenderness on right foot more than the left Skin: No rashes,no icterus. MSK: Normal muscle bulk,tone, power    Medications reviewed:  Scheduled Meds:  amLODipine  5 mg Oral Daily   heparin  5,000 Units Subcutaneous Q8H   influenza vac split quadrivalent PF  0.5 mL Intramuscular Tomorrow-1000   Continuous Infusions:   ceFAZolin (ANCEF) IV     lactated ringers 125 mL/hr at 01/21/23 0727   linezolid (ZYVOX) IV 600 mg (01/21/23 0951)    Diet Order             Diet regular Room service appropriate? Yes; Fluid consistency: Thin  Diet effective now                  Intake/Output Summary (Last 24 hours) at 01/21/2023 1127 Last data filed at 01/21/2023 0900 Gross per 24 hour  Intake 240 ml  Output 1700 ml  Net -1460 ml   Net IO Since Admission: -2,322.24 mL [01/21/23 1127]  Wt Readings from Last 3 Encounters:  01/19/23 83.2 kg  12/29/22 79.5 kg  12/16/22 78 kg     Unresulted Labs (From admission, onward)     Start     Ordered   01/18/23  [id number]  Basic metabolic panel  Daily,   R     Question:  Specimen collection method  Answer:  Lab=Lab collect   01/17/23 0738   01/18/23 0500  CBC  Daily,   R     Question:  Specimen collection method  Answer:  Lab=Lab collect   01/17/23 0738   01/17/23 1447  MRSA Next Gen by PCR, Nasal  ONCE - URGENT,   URGENT        01/17/23 1446          Data Reviewed: I have personally reviewed following labs and imaging studies CBC: Recent Labs  Lab 01/16/23 2345 01/17/23 0422 01/18/23 0322 01/19/23 0344 01/20/23 0155 01/21/23 0331  WBC 26.7* 29.1* 28.9* 31.8* 29.3* 23.3*  NEUTROABS 22.8* 27.4*  --   --   --   --   HGB 10.6* 10.0* 9.4* 9.1* 9.4* 8.6*  HCT 31.5* 30.4* 26.3* 25.6* 27.5* 25.3*  MCV 57.2* 57.7* 54.6* 54.5* 55.1* 55.1*  PLT 224 195 230 255 277 [id number]   Basic Metabolic Panel: Recent Labs  Lab 01/17/23 0124 01/17/23 0422 01/18/23 0457 01/19/23 0344 01/20/23 0155 01/21/23 0331  NA  --  136 138 137 137 134*  K  --  3.7 3.8 3.8 3.9 4.0  CL  --  103 108 107 107 104  CO2  --  17* 18* 20* 21* 22  GLUCOSE  --  107* 89 153* 98 110*  BUN  --  54* 40* 28* 16 12  CREATININE  --  4.96* 2.85* 1.81* 1.30* 1.15  CALCIUM  --  8.1* 8.4* 8.3* 8.5* 8.0*  MG 2.3 2.3  --   --   --   --    GFR: Estimated Creatinine Clearance: 77.6 mL/min (by C-G formula based on SCr of 1.15 mg/dL). Liver Function Tests: Recent Labs  Lab 01/16/23 2345 01/17/23 0422  AST 23 23  ALT 23 22  ALKPHOS 87 100  BILITOT 0.7 1.2  PROT 7.0 6.4*  ALBUMIN 2.6* 2.4*   Recent Labs  Lab 01/17/23 0422  INR 1.4*   Recent Labs  Lab 01/16/23 2345 01/17/23 0124  LATICACIDVEN 1.2 1.0    Recent Results (from the past 240 hour(s))  Blood culture (routine x 2)     Status: None (Preliminary result)   Collection Time: 01/17/23  1:24 AM   Specimen: BLOOD RIGHT ARM  Result Value Ref Range Status   Specimen Description BLOOD RIGHT ARM  Final   Special Requests   Final    BOTTLES DRAWN AEROBIC AND ANAEROBIC  Blood Culture results may not be optimal due to an inadequate volume of blood received in culture bottles   Culture   Final    NO GROWTH 4 DAYS Performed at [facility], [place] [address] [place], [place] [address]    Report Status PENDING  Incomplete  Blood culture (routine x 2)     Status: None (Preliminary result)   Collection Time: 01/17/23  1:24 AM   Specimen: BLOOD LEFT ARM  Result Value Ref Range Status   Specimen Description BLOOD LEFT ARM  Final   Special Requests   Final    BOTTLES DRAWN AEROBIC AND ANAEROBIC Blood Culture results may not be optimal due to an inadequate volume of blood received in culture bottles   Culture   Final    NO GROWTH 4 DAYS Performed at [facility], [place] [address], [place], [place] [address]    Report Status PENDING  Incomplete    Antimicrobials: Anti-infectives (From admission, onward)    Start     Dose/Rate Route Frequency Ordered Stop   01/21/23 1000  ceFAZolin (ANCEF) IVPB 2g/100 mL premix        2 g 200 mL/hr over 30 Minutes Intravenous Every 8 hours 01/21/23 0945     01/20/23 1819  ceFEPIme (MAXIPIME) 2 g in sodium chloride 0.9 % 100 mL IVPB  Status:  Discontinued        2 g 200 mL/hr over 30 Minutes Intravenous Every 8 hours 01/20/23 1104 01/21/23 0945   01/19/23 1000  ceFEPIme (MAXIPIME) 2 g  in sodium chloride 0.9 % 100 mL IVPB  Status:  Discontinued        2 g 200 mL/hr over 30 Minutes Intravenous Every 12 hours 01/19/23 0738 01/20/23 1104   01/17/23 2200  ceFEPIme (MAXIPIME) 2 g in sodium chloride 0.9 % 100 mL IVPB  Status:  Discontinued        2 g 200 mL/hr over 30 Minutes Intravenous Every 24 hours 01/17/23 0245 01/19/23 0738   01/17/23 1600  linezolid (ZYVOX) IVPB 600 mg        600 mg 300 mL/hr over 60 Minutes Intravenous Every 12 hours 01/17/23 1507     01/17/23 0245  vancomycin variable dose per unstable renal function (pharmacist dosing)  Status:  Discontinued         Does not apply See admin instructions 01/17/23  0245 01/17/23 1507   01/17/23 0100  vancomycin (VANCOREADY) IVPB 1500 mg/300 mL        1,500 mg 150 mL/hr over 120 Minutes Intravenous  Once 01/17/23 0057 01/17/23 0354   01/17/23 0100  ceFEPIme (MAXIPIME) 2 g in sodium chloride 0.9 % 100 mL IVPB        2 g 200 mL/hr over 30 Minutes Intravenous  Once 01/17/23 0057 01/17/23 0256      Culture/Microbiology    Component Value Date/Time   SDES BLOOD RIGHT ARM 01/17/2023 0124   SDES BLOOD LEFT ARM 01/17/2023 0124   SPECREQUEST  01/17/2023 0124    BOTTLES DRAWN AEROBIC AND ANAEROBIC Blood Culture results may not be optimal due to an inadequate volume of blood received in culture bottles   SPECREQUEST  01/17/2023 0124    BOTTLES DRAWN AEROBIC AND ANAEROBIC Blood Culture results may not be optimal due to an inadequate volume of blood received in culture bottles   CULT  01/17/2023 0124    NO GROWTH 4 DAYS Performed at [facility], [place] [address] [place], [place] [address]    CULT  01/17/2023 0124    NO GROWTH 4 DAYS Performed at [facility] [address] [place], [place] [address]    REPTSTATUS PENDING 01/17/2023 0124   REPTSTATUS PENDING 01/17/2023 0124    Radiology Studies: MR FOOT RIGHT WO CONTRAST  Result Date: 01/20/2023 CLINICAL DATA:  Soft tissue infection suspected EXAM: MRI OF THE RIGHT FOREFOOT WITHOUT CONTRAST TECHNIQUE: Multiplanar, multisequence MR imaging of the right foot was performed. No intravenous contrast was administered. COMPARISON:  Radiographs dated January 16, 2023 FINDINGS: Bones/Joint/Cartilage No fracture or dislocation. Normal alignment. No joint effusion. No marrow signal abnormality. Ligaments Collateral ligaments are intact.  Lisfranc ligament is intact. Muscles and Tendons There is intramuscular edema of the plantar muscles which could be reactive secondary to infectious/inflammatory process. No fluid collection or abscess. Tendons are intact. Soft tissue Skin thickening and marked  subcutaneous soft tissue edema about the dorsum of the foot. No soft tissue mass. No fluid collection or abscess. IMPRESSION: 1. Skin thickening and marked subcutaneous soft tissue edema about the dorsum of the foot consistent with cellulitis. 2. Intramuscular edema of the plantar muscles which could be reactive secondary to infectious/inflammatory process. No fluid collection or abscess. 3. No evidence of osteomyelitis. Electronically Signed   By: Keane Police D.O.   On: 01/20/2023 22:05   MR TIBIA FIBULA RIGHT WO CONTRAST  Result Date: 01/20/2023 CLINICAL DATA:  Soft tissue infection suspected EXAM: MRI OF LOWER RIGHT EXTREMITY WITHOUT CONTRAST TECHNIQUE: Multiplanar, multisequence MR imaging of the right lower extremity from  above knee to below ankle was performed. No intravenous contrast was administered. COMPARISON:  None Available. FINDINGS: Bones/Joint/Cartilage Marrow signal is within normal limits. No evidence of fracture or osteonecrosis no evidence of osteomyelitis. Ligaments Intact Muscles and Tendons Muscles are normal in bulk. No intramuscular hematoma or fluid collection. Trace amount of fluid about the posterior aspect of the medial and lateral gastrocnemius muscles no intramuscular hematoma or fluid collection. Soft tissues There is skin thickening and marked subcutaneous soft tissue edema surrounding the right lower extremity suggesting cellulitis. No drainable fluid collection or abscess. IMPRESSION: 1. Skin thickening and marked subcutaneous soft tissue edema surrounding the right lower extremity suggesting cellulitis. No drainable fluid collection or abscess. 2. No evidence of osteomyelitis. 3. Trace amount of fluid about the posterior aspect of the medial and lateral gastrocnemius muscles, which may be reactive secondary to cellulitis. No intramuscular hematoma or fluid collection. Electronically Signed   By: Keane Police D.O.   On: 01/20/2023 21:54     LOS: 4 days   Antonieta Pert,  MD [facility]  01/21/2023, 11:27 AM

## 2023-01-21 NOTE — TOC CAGE-AID Note (Signed)
Transition of Care [facility]) - CAGE-AID Screening   Patient Details  Name: Timothy Mcdowell MRN: [id number] Date of Birth: 15-Aug-1971  Transition of Care [facility]) CM/SW Contact:    Sharin Mons, RN Phone Number: 01/21/2023, 2:22 PM   Clinical Narrative:  CAGE-AID screening completed. Pt states has smoked marijuana for years, 2-3 blunts /day. States feels can stop on own. Open to educational information/ resources.  CAGE-AID Screening:    Have You Ever Felt You Ought to Cut Down on Your Drinking or Drug Use?: Yes Have People Annoyed You By Critizing Your Drinking Or Drug Use?: No Have You Felt Bad Or Guilty About Your Drinking Or Drug Use?: No Have You Ever Had a Drink or Used Drugs First Thing In The Morning to Steady Your Nerves or to Get Rid of a Hangover?: Yes CAGE-AID Score: 2  Substance Abuse Education Offered: Yes  Substance abuse interventions: Scientist, clinical (histocompatibility and immunogenetics)

## 2023-01-21 NOTE — TOC Initial Note (Addendum)
Transition of Care [facility]) - Initial/Assessment Note    Patient Details  Name: Timothy Mcdowell MRN: [id number] Date of Birth: 1971/01/24  Transition of Care [facility]) CM/SW Contact:    Sharin Mons, RN Phone Number: 01/21/2023, 2:01 PM  Clinical Narrative:                 Presents with cellulitis of the R foot/ AKI. Consult received for homelessness. Pt states resides with friend Lorenz Coaster [phone]) and will return to Netta's residence once d/c. States has lived with [place] the past 2 months. States is  from [place], and  has been in [place] X 10 yrs.  PTA independent with ADL's. Owns RW, cane and crutches. Pt without RX med concerns or transportation issues.  Plan: per MD continue ABX therapy  a few more days, MRI scan does not show any abscess or osteomyelitis.    TOC team following and will assist with TOC needs....  Expected Discharge Plan: Home/Self Care Barriers to Discharge: Continued Medical Work up   Patient Goals and CMS Choice            Expected Discharge Plan and Services   Discharge Planning Services: CM Consult   Living arrangements for the past 2 months: Apartment                                      Prior Living Arrangements/Services Living arrangements for the past 2 months: Apartment   Patient language and need for interpreter reviewed:: Yes Do you feel safe going back to the place where you live?: Yes      Need for Family Participation in Patient Care: Yes (Comment) Care giver support system in place?: No (comment) Current home services: DME (cane , RW, crutches) Criminal Activity/Legal Involvement Pertinent to Current Situation/Hospitalization: No - Comment as needed  Activities of Daily Living Home Assistive Devices/Equipment: Cane (specify quad or straight), Crutches ADL Screening (condition at time of admission) Patient's cognitive ability adequate to safely complete daily activities?: Yes Is the patient deaf or have difficulty hearing?:  No Does the patient have difficulty seeing, even when wearing glasses/contacts?: No Does the patient have difficulty concentrating, remembering, or making decisions?: No Patient able to express need for assistance with ADLs?: No Does the patient have difficulty dressing or bathing?: Yes Independently performs ADLs?: No Communication: Independent Dressing (OT): Needs assistance Is this a change from baseline?: Pre-admission baseline Grooming: Needs assistance Is this a change from baseline?: Pre-admission baseline Feeding: Independent Bathing: Needs assistance Is this a change from baseline?: Pre-admission baseline Toileting: Needs assistance Is this a change from baseline?: Pre-admission baseline In/Out Bed: Needs assistance Is this a change from baseline?: Pre-admission baseline Walks in Home: Needs assistance Is this a change from baseline?: Pre-admission baseline Does the patient have difficulty walking or climbing stairs?: Yes Weakness of Legs: Both Weakness of Arms/Hands: None  Permission Sought/Granted   Permission granted to share information with : Yes, Verbal Permission Granted              Emotional Assessment Appearance:: Appears stated age Attitude/Demeanor/Rapport: Engaged Affect (typically observed): Accepting Orientation: : Oriented to Self, Oriented to Place, Oriented to  Time, Oriented to Situation Alcohol / Substance Use: Illicit Drugs (marijuana / blunts 3/day) Psych Involvement: No (comment)  Admission diagnosis:  Cellulitis of right foot [L03.115] Cellulitis of right lower extremity [L03.115] Acute renal failure, unspecified acute renal failure type ([place]) [N17.9]  Excessive use of nonsteroidal anti-inflammatory drug (NSAID) [F19.90] Patient Active Problem List   Diagnosis Date Noted   Cellulitis of right foot 01/17/2023   Severe sepsis ([place]) 01/17/2023   AKI (acute kidney injury) ([place]) 01/17/2023   Acute anemia 01/17/2023   Essential hypertension  12/29/2022   Hospital discharge follow-up 12/29/2022   Tobacco abuse 12/29/2022   Cellulitis 12/13/2022   Tobacco abuse counseling 11/26/2022   Rash and other nonspecific skin eruption 11/26/2022   Itching 11/26/2022   Right foot pain 11/26/2022   Bilateral leg edema 11/26/2022   Encounter for examination following treatment at hospital 11/26/2022   Depression 11/26/2022   PCP:  Renee Rival, FNP Pharmacy:   [facility] [place], [address] [place] [address] Phone: [phone] Fax: [phone]  [facility] [address] [address] Phone: [phone] Fax: [phone]     Social Determinants of Health (SDOH) Social History: SDOH Screenings   Food Insecurity: Food Insecurity Present (01/17/2023)  Housing: High Risk (01/17/2023)  Transportation Needs: Unmet Transportation Needs (01/17/2023)  Utilities: At Risk (01/17/2023)  Depression (PHQ2-9): High Risk (12/29/2022)  Tobacco Use: High Risk (01/17/2023)   SDOH Interventions: Food Insecurity Interventions: Inpatient TOC Housing Interventions: Inpatient TOC Transportation Interventions: Inpatient TOC Utilities Interventions: Inpatient TOC   Readmission Risk Interventions     No data to display

## 2023-01-21 NOTE — Progress Notes (Signed)
Pharmacy Antibiotic Note  Timothy Mcdowell is a 52 y.o. male admitted on 01/16/2023 with AKI and RLE cellulitis.Patient was started on linezolid and cefepime. MRI 2/13 negative for deep abscess or osteomyelitis. Ok to narrow cefepime to cefazolin per d/w ortho. Continue linezolid for now.   WBC improving, patient is afebrile.   Plan: Continue linezolid 658m IV q12 hours D/c cefepime Begin cefazolin 2g IV q8 hours  Height: 5' 10"$  (177.8 cm) Weight: 83.2 kg (183 lb 6.8 oz) IBW/kg (Calculated) : 73  Temp (24hrs), Avg:98.7 F (37.1 C), Min:98 F (36.7 C), Max:99.4 F (37.4 C)  Recent Labs  Lab 01/16/23 2345 01/17/23 0124 01/17/23 0422 01/18/23 0322 01/18/23 0457 01/19/23 0344 01/20/23 0155 01/21/23 0331  WBC 26.7*  --  29.1* 28.9*  --  31.8* 29.3* 23.3*  CREATININE 5.15*  --  4.96*  --  2.85* 1.81* 1.30* 1.15  LATICACIDVEN 1.2 1.0  --   --   --   --   --   --      Estimated Creatinine Clearance: 77.6 mL/min (by C-G formula based on SCr of 1.15 mg/dL).    Allergies  Allergen Reactions   Other Rash    strawberries   Antimicrobials this admission:  Cefepime 2/10 >>2/14 Vanc x 1 on 2/9  Linezolid 2/10>> Cefazolin 2/14 >>   Microbiology results:  2/10 blood: ngtd  MDimple Nanas PharmD, BCPS 01/21/2023 9:49 AM

## 2023-01-22 DIAGNOSIS — L03115 Cellulitis of right lower limb: Secondary | ICD-10-CM | POA: Diagnosis not present

## 2023-01-22 LAB — CBC
HCT: 24.6 % — ABNORMAL LOW (ref 39.0–52.0)
Hemoglobin: 8.4 g/dL — ABNORMAL LOW (ref 13.0–17.0)
MCH: 18.8 pg — ABNORMAL LOW (ref 26.0–34.0)
MCHC: 34.1 g/dL (ref 30.0–36.0)
MCV: 55.2 fL — ABNORMAL LOW (ref 80.0–100.0)
Platelets: 236 10*3/uL (ref 150–400)
RBC: 4.46 MIL/uL (ref 4.22–5.81)
RDW: 16.9 % — ABNORMAL HIGH (ref 11.5–15.5)
WBC: 18.5 10*3/uL — ABNORMAL HIGH (ref 4.0–10.5)
nRBC: 0.2 % (ref 0.0–0.2)

## 2023-01-22 LAB — CULTURE, BLOOD (ROUTINE X 2)
Culture: NO GROWTH
Culture: NO GROWTH

## 2023-01-22 LAB — BASIC METABOLIC PANEL
Anion gap: 8 (ref 5–15)
BUN: 9 mg/dL (ref 6–20)
CO2: 23 mmol/L (ref 22–32)
Calcium: 7.8 mg/dL — ABNORMAL LOW (ref 8.9–10.3)
Chloride: 101 mmol/L (ref 98–111)
Creatinine, Ser: 1.08 mg/dL (ref 0.61–1.24)
GFR, Estimated: >60 mL/min (ref >60)
Glucose, Bld: 100 mg/dL — ABNORMAL HIGH (ref 70–99)
Potassium: 3.8 mmol/L (ref 3.5–5.1)
Sodium: 132 mmol/L — ABNORMAL LOW (ref 135–145)

## 2023-01-22 MED ORDER — SENNA 8.6 MG PO TABS
1.0000 | ORAL_TABLET | Freq: Every day | ORAL | Status: DC
  Administered 2023-01-22 – 2023-01-28 (×7): 8.6 mg via ORAL
  Filled 2023-01-22 (×7): qty 1

## 2023-01-22 MED ORDER — POLYETHYLENE GLYCOL 3350 17 G PO PACK
17.0000 g | PACK | Freq: Every day | ORAL | Status: DC
  Administered 2023-01-22 – 2023-01-28 (×6): 17 g via ORAL
  Filled 2023-01-22 (×6): qty 1

## 2023-01-22 NOTE — Progress Notes (Signed)
CSW met with pt regarding SDOH: food, housing, transportation.  Pt reports he does not have stable housing and stays between several different friends.  Pt reports he is from [place], still has family there, but he has 53 year old daughter in [place] who lives with her mother.  Pt does not want to leave the area due to his daughter.  Pt does not have any income at this time, works temp jobs occasionally.  CSW discussed shelter system.  Pt does not want to pursue [employer].  Pt reports he can continue with his current situation--his friend Lorenz Coaster is one of the people he stays with--and this is preferable to pursuing shelter placement.  Homeless Resource list given.  Pt reports he was receiving food stamps, was cut off some time ago.  Food Development worker, international aid provided.  Transportation--pt does have medicaid.  Discussed medicaid transportation option and pt would like to pursue this.  CSW provided him with transportation resource list.  We called [place] to initiate medicaid transportation--pt on hold currently.  CSW to check back. Lurline Idol, MSW, LCSW 2/15/20241:59 PM

## 2023-01-22 NOTE — Progress Notes (Signed)
PROGRESS NOTE Odessa Mcfail  [id number] DOB: 12-19-1970 DOA: 01/16/2023 PCP: Renee Rival, FNP   Brief Narrative/Hospital Course: 52 y.o. male with medical history significant for essential hypertension, chronic tobacco abuse presented with worsening swelling and pain on his right lower leg, was recently admitted in January for same and was treated with IV antibiotics and discharged home on oral antibiotics but for the past 2 weeks patient noticing worsening of the infection.  He was initially not taking ibuprofen or Keflex as he did not have any money however has since picked up the medication as has been taking for past several days without much relief. In the ED vital signs stable BP tachycardia afebrile labs with leukocytosis metabolic acidosis AKI. EKG sinus rhythm nonspecific T wave changes, chest x-ray no evidence of acute cardiopulmonary process, patient was admitted for further evaluation of sepsis with secondary cellulitis and AKI and anemia. His significant AKI nicely improved with IV fluids and minimizing nephrotoxic medications. Dr. Sharol Given has seen the patient for his foot infection    Subjective: Seen and examined Overnight afebrile, labs with WBC count is steadily improving, renal function remains stable ON pain management IV Dilaudid and hydrocodone He reports he needs to talk with social worker he has no place to go  Assessment and Plan: Principal Problem:   Cellulitis of right foot Active Problems:   Essential hypertension   Tobacco abuse   Severe sepsis ([place])   AKI (acute kidney injury) ([place])   Acute anemia  Right lower extremity cellulitis with associated severe sepsis POA Recent admission for right lower extremity first week of January-where MRI was negative for osteo myelitis Injury w/ stepped on a nail in the mid December: X-ray in ed-severe soft tissue edema w/o soft tissue gas or foreign body or osseous changes.  Patient has been having pain and  difficulty with tibia-fibula : Finding of cellulitis, no drainable fluid collection or abscess or evidence of osteomyelitis.  Trace amount of fluid in the muscles area likely reactive due to cellulitis.  Cont  Zyvox and Zosyn, pain management. Continue plan as per orthopedics Recent Labs  Lab 01/16/23 2345 01/17/23 0124 01/17/23 0422 01/18/23 0322 01/19/23 0344 01/20/23 0155 01/21/23 0331 01/22/23 0436  WBC 26.7*  --    < > 28.9* 31.8* 29.3* 23.3* 18.5*  LATICACIDVEN 1.2 1.0  --   --   --   --   --   --    < > = values in this interval not displayed.  AKI:2/2 dehydration lisinopril ibuprofen use and also with infection.  Resolved.  Cut down on IV fluids Recent Labs    12/13/22 0816 12/14/22 0205 12/29/22 1412 01/16/23 2345 01/17/23 0422 01/18/23 0457 01/19/23 0344 01/20/23 0155 01/21/23 0331 01/22/23 0436  BUN 10 15 14 $ 57* 54* 40* 28* 16 12 9  $ CREATININE 1.10 1.02 0.89 5.15* 4.96* 2.85* 1.81* 1.30* [id number]    Metabolic acidosis: In the setting of AKI, resolved Anemia microcytic: Baseline hb ~12g, holding 8--10 gm. Anemia panel shows elevated ferritin iron low. Add Po iron on dc. Recent Labs    12/13/22 0816 12/14/22 0205 01/17/23 0422 01/18/23 0322 01/19/23 0344 01/20/23 0155 01/21/23 0331 01/22/23 0436  HGB 12.4*   < > 10.0* 9.4* 9.1* 9.4* 8.6* 8.4*  MCV 59.6*   < > 57.7* 54.6* 54.5* 55.1* 55.1* 55.2*  FERRITIN 208  --  1,051*  --   --   --   --   --   TIBC  --   --  179*  --   --   --   --   --   IRON  --   --  23*  --   --   --   --   --   RETICCTPCT  --   --  0.6  --   --   --   --   --    < > = values in this interval not displayed.     Essential hypertension: BP stable. on amlodipine Sinus tachycardia in the setting of dehydration/pain foot infection, improving. Chronic tobacco abuse: counseled  DVT prophylaxis: heparin injection 5,000 Units Start: 01/17/23 0600 Code Status:   Code Status: Full Code  Family Communication: plan of care discussed with  patient at bedside. Patient status is: Inpatient because of foot infection and AKI Level of care: Telemetry Medical   Dispo: The patient is from: Home            Anticipated disposition: Home after a few more days of IV antibiotics Objective: Vitals last 24 hrs: Vitals:   01/21/23 0745 01/21/23 1501 01/21/23 2131 01/22/23 0729  BP: (!) 140/78 (!) 145/93 (!) 171/79 (!) 141/86  Pulse: 99 99  92  Resp: 18 19  17  $ Temp: 99.4 F (37.4 C) 98.8 F (37.1 C) 97.8 F (36.6 C) 98.9 F (37.2 C)  TempSrc: Oral Oral Oral Oral  SpO2: 97% 97% 98% 94%  Weight:      Height:       Weight change:   Physical Examination: General exam: AA, weak,older appearing HEENT:Oral mucosa moist, Ear/Nose WNL grossly, dentition normal. Respiratory system: bilaterally clear BS,no use of accessory muscle Cardiovascular system: S1 & S2 +, regular rate, JVD . Gastrointestinal system: Abdomen soft, NT,ND,BS+ Nervous System:Alert, awake, moving extremities and grossly nonfocal Extremities: LE ankle edema on b/l foot with tenderness , lower extremities warm Skin: No rashes,no icterus. MSK: Normal muscle bulk,tone, power    Medications reviewed:  Scheduled Meds:  amLODipine  5 mg Oral Daily   heparin  5,000 Units Subcutaneous Q8H   influenza vac split quadrivalent PF  0.5 mL Intramuscular Tomorrow-1000   polyethylene glycol  17 g Oral Daily   senna  1 tablet Oral Daily   Continuous Infusions:   ceFAZolin (ANCEF) IV 2 g (01/22/23 0834)   lactated ringers 125 mL/hr at 01/21/23 0727   linezolid (ZYVOX) IV 600 mg (01/22/23 0949)    Diet Order             Diet regular Room service appropriate? Yes; Fluid consistency: Thin  Diet effective now                  Intake/Output Summary (Last 24 hours) at 01/22/2023 1202 Last data filed at 01/22/2023 0700 Gross per 24 hour  Intake 480 ml  Output 400 ml  Net 80 ml   Net IO Since Admission: -2,242.24 mL [01/22/23 1202]  Wt Readings from Last 3 Encounters:   01/19/23 83.2 kg  12/29/22 79.5 kg  12/16/22 78 kg     Unresulted Labs (From admission, onward)     Start     Ordered   01/17/23 1447  MRSA Next Gen by PCR, Nasal  ONCE - URGENT,   URGENT        01/17/23 1446          Data Reviewed: I have personally reviewed following labs and imaging studies CBC: Recent Labs  Lab 01/16/23 2345 01/17/23 0422 01/18/23 0322 01/19/23 0344 01/20/23 0155  01/21/23 0331 01/22/23 0436  WBC 26.7* 29.1* 28.9* 31.8* 29.3* 23.3* 18.5*  NEUTROABS 22.8* 27.4*  --   --   --   --   --   HGB 10.6* 10.0* 9.4* 9.1* 9.4* 8.6* 8.4*  HCT 31.5* 30.4* 26.3* 25.6* 27.5* 25.3* 24.6*  MCV 57.2* 57.7* 54.6* 54.5* 55.1* 55.1* 55.2*  PLT 224 195 230 255 277 262 [id number]   Basic Metabolic Panel: Recent Labs  Lab 01/17/23 0124 01/17/23 0422 01/18/23 0457 01/19/23 0344 01/20/23 0155 01/21/23 0331 01/22/23 0436  NA  --  136 138 137 137 134* 132*  K  --  3.7 3.8 3.8 3.9 4.0 3.8  CL  --  103 108 107 107 104 101  CO2  --  17* 18* 20* 21* 22 23  GLUCOSE  --  107* 89 153* 98 110* 100*  BUN  --  54* 40* 28* 16 12 9  $ CREATININE  --  4.96* 2.85* 1.81* 1.30* 1.15 1.08  CALCIUM  --  8.1* 8.4* 8.3* 8.5* 8.0* 7.8*  MG 2.3 2.3  --   --   --   --   --    GFR: Estimated Creatinine Clearance: 82.6 mL/min (by C-G formula based on SCr of 1.08 mg/dL). Liver Function Tests: Recent Labs  Lab 01/16/23 2345 01/17/23 0422  AST 23 23  ALT 23 22  ALKPHOS 87 100  BILITOT 0.7 1.2  PROT 7.0 6.4*  ALBUMIN 2.6* 2.4*   Recent Labs  Lab 01/17/23 0422  INR 1.4*   Recent Labs  Lab 01/16/23 2345 01/17/23 0124  LATICACIDVEN 1.2 1.0    Recent Results (from the past 240 hour(s))  Blood culture (routine x 2)     Status: None   Collection Time: 01/17/23  1:24 AM   Specimen: BLOOD RIGHT ARM  Result Value Ref Range Status   Specimen Description BLOOD RIGHT ARM  Final   Special Requests   Final    BOTTLES DRAWN AEROBIC AND ANAEROBIC Blood Culture results may not be optimal due  to an inadequate volume of blood received in culture bottles   Culture   Final    NO GROWTH 5 DAYS Performed at [facility], [place] [address], [place], [place] [address]    Report Status 01/22/2023 FINAL  Final  Blood culture (routine x 2)     Status: None   Collection Time: 01/17/23  1:24 AM   Specimen: BLOOD LEFT ARM  Result Value Ref Range Status   Specimen Description BLOOD LEFT ARM  Final   Special Requests   Final    BOTTLES DRAWN AEROBIC AND ANAEROBIC Blood Culture results may not be optimal due to an inadequate volume of blood received in culture bottles   Culture   Final    NO GROWTH 5 DAYS Performed at [facility], [place] [address] [place], [place] [address]    Report Status 01/22/2023 FINAL  Final    Antimicrobials: Anti-infectives (From admission, onward)    Start     Dose/Rate Route Frequency Ordered Stop   01/21/23 1000  ceFAZolin (ANCEF) IVPB 2g/100 mL premix        2 g 200 mL/hr over 30 Minutes Intravenous Every 8 hours 01/21/23 0945     01/20/23 1819  ceFEPIme (MAXIPIME) 2 g in sodium chloride 0.9 % 100 mL IVPB  Status:  Discontinued        2 g 200 mL/hr over 30 Minutes Intravenous Every 8 hours 01/20/23 1104 01/21/23  0945   01/19/23 1000  ceFEPIme (MAXIPIME) 2 g in sodium chloride 0.9 % 100 mL IVPB  Status:  Discontinued        2 g 200 mL/hr over 30 Minutes Intravenous Every 12 hours 01/19/23 0738 01/20/23 1104   01/17/23 2200  ceFEPIme (MAXIPIME) 2 g in sodium chloride 0.9 % 100 mL IVPB  Status:  Discontinued        2 g 200 mL/hr over 30 Minutes Intravenous Every 24 hours 01/17/23 0245 01/19/23 0738   01/17/23 1600  linezolid (ZYVOX) IVPB 600 mg        600 mg 300 mL/hr over 60 Minutes Intravenous Every 12 hours 01/17/23 1507     01/17/23 0245  vancomycin variable dose per unstable renal function (pharmacist dosing)  Status:  Discontinued         Does not apply See admin instructions 01/17/23 0245 01/17/23 1507   01/17/23 0100  vancomycin  (VANCOREADY) IVPB 1500 mg/300 mL        1,500 mg 150 mL/hr over 120 Minutes Intravenous  Once 01/17/23 0057 01/17/23 0354   01/17/23 0100  ceFEPIme (MAXIPIME) 2 g in sodium chloride 0.9 % 100 mL IVPB        2 g 200 mL/hr over 30 Minutes Intravenous  Once 01/17/23 0057 01/17/23 0256      Culture/Microbiology    Component Value Date/Time   SDES BLOOD RIGHT ARM 01/17/2023 0124   SDES BLOOD LEFT ARM 01/17/2023 0124   SPECREQUEST  01/17/2023 0124    BOTTLES DRAWN AEROBIC AND ANAEROBIC Blood Culture results may not be optimal due to an inadequate volume of blood received in culture bottles   SPECREQUEST  01/17/2023 0124    BOTTLES DRAWN AEROBIC AND ANAEROBIC Blood Culture results may not be optimal due to an inadequate volume of blood received in culture bottles   CULT  01/17/2023 0124    NO GROWTH 5 DAYS Performed at [facility], [place] [address] [place], [place] [address]    CULT  01/17/2023 0124    NO GROWTH 5 DAYS Performed at [place] [address], [place], [place] [address]    REPTSTATUS 01/22/2023 FINAL 01/17/2023 0124   REPTSTATUS 01/22/2023 FINAL 01/17/2023 0124    Radiology Studies: MR FOOT RIGHT WO CONTRAST  Result Date: 01/20/2023 CLINICAL DATA:  Soft tissue infection suspected EXAM: MRI OF THE RIGHT FOREFOOT WITHOUT CONTRAST TECHNIQUE: Multiplanar, multisequence MR imaging of the right foot was performed. No intravenous contrast was administered. COMPARISON:  Radiographs dated January 16, 2023 FINDINGS: Bones/Joint/Cartilage No fracture or dislocation. Normal alignment. No joint effusion. No marrow signal abnormality. Ligaments Collateral ligaments are intact.  Lisfranc ligament is intact. Muscles and Tendons There is intramuscular edema of the plantar muscles which could be reactive secondary to infectious/inflammatory process. No fluid collection or abscess. Tendons are intact. Soft tissue Skin thickening and marked subcutaneous soft tissue edema about the  dorsum of the foot. No soft tissue mass. No fluid collection or abscess. IMPRESSION: 1. Skin thickening and marked subcutaneous soft tissue edema about the dorsum of the foot consistent with cellulitis. 2. Intramuscular edema of the plantar muscles which could be reactive secondary to infectious/inflammatory process. No fluid collection or abscess. 3. No evidence of osteomyelitis. Electronically Signed   By: Keane Police D.O.   On: 01/20/2023 22:05   MR TIBIA FIBULA RIGHT WO CONTRAST  Result Date: 01/20/2023 CLINICAL DATA:  Soft tissue infection suspected EXAM: MRI OF LOWER RIGHT EXTREMITY WITHOUT  CONTRAST TECHNIQUE: Multiplanar, multisequence MR imaging of the right lower extremity from above knee to below ankle was performed. No intravenous contrast was administered. COMPARISON:  None Available. FINDINGS: Bones/Joint/Cartilage Marrow signal is within normal limits. No evidence of fracture or osteonecrosis no evidence of osteomyelitis. Ligaments Intact Muscles and Tendons Muscles are normal in bulk. No intramuscular hematoma or fluid collection. Trace amount of fluid about the posterior aspect of the medial and lateral gastrocnemius muscles no intramuscular hematoma or fluid collection. Soft tissues There is skin thickening and marked subcutaneous soft tissue edema surrounding the right lower extremity suggesting cellulitis. No drainable fluid collection or abscess. IMPRESSION: 1. Skin thickening and marked subcutaneous soft tissue edema surrounding the right lower extremity suggesting cellulitis. No drainable fluid collection or abscess. 2. No evidence of osteomyelitis. 3. Trace amount of fluid about the posterior aspect of the medial and lateral gastrocnemius muscles, which may be reactive secondary to cellulitis. No intramuscular hematoma or fluid collection. Electronically Signed   By: Keane Police D.O.   On: 01/20/2023 21:54     LOS: 5 days   Antonieta Pert, MD [facility]  01/22/2023, 12:02 PM

## 2023-01-22 NOTE — Progress Notes (Signed)
Mobility Specialist Progress Note    01/22/23 1551  Mobility  Activity Ambulated with assistance in room  Level of Assistance Minimal assist, patient does 75% or more  Assistive Device Crutches  Distance Ambulated (ft) 25 ft  Activity Response Tolerated fair  Mobility Referral Yes  $Mobility charge 1 Mobility   Pre-Mobility: 90 HR Post-Mobility: 86 HR  Pt received in bed and agreeable. C/o 10/10 feet pain and specifically that his left foot felt worse than his right. Returned to chair with call bell in reach.   Hildred Alamin Mobility Specialist  Please Psychologist, sport and exercise or Rehab Office at [phone]

## 2023-01-23 ENCOUNTER — Other Ambulatory Visit (HOSPITAL_COMMUNITY): Payer: Self-pay

## 2023-01-23 DIAGNOSIS — L03115 Cellulitis of right lower limb: Secondary | ICD-10-CM | POA: Diagnosis not present

## 2023-01-23 MED ORDER — CEPHALEXIN 500 MG PO CAPS
500.0000 mg | ORAL_CAPSULE | Freq: Three times a day (TID) | ORAL | 0 refills | Status: DC
  Filled 2023-01-23: qty 9, 3d supply, fill #0

## 2023-01-23 MED ORDER — LINEZOLID 600 MG PO TABS
600.0000 mg | ORAL_TABLET | Freq: Two times a day (BID) | ORAL | 0 refills | Status: DC
  Filled 2023-01-23: qty 6, 3d supply, fill #0

## 2023-01-23 MED ORDER — LINEZOLID 600 MG PO TABS
600.0000 mg | ORAL_TABLET | Freq: Two times a day (BID) | ORAL | Status: AC
  Administered 2023-01-24 – 2023-01-26 (×6): 600 mg via ORAL
  Filled 2023-01-23 (×6): qty 1

## 2023-01-23 MED ORDER — CEPHALEXIN 500 MG PO CAPS
500.0000 mg | ORAL_CAPSULE | Freq: Three times a day (TID) | ORAL | Status: AC
  Administered 2023-01-24 – 2023-01-26 (×9): 500 mg via ORAL
  Filled 2023-01-23 (×9): qty 1

## 2023-01-23 NOTE — Plan of Care (Signed)

## 2023-01-23 NOTE — Progress Notes (Signed)
   01/23/23 1800  Provider Notification  Provider Name/Title Antonieta Pert  Date Provider Notified 01/23/23  Time Provider Notified 1700  Method of Notification Page  Notification Reason Other (Comment) (painful L ankle, more swollen. doppler was used to get pedal pulses.)  Provider response See new orders (duplex for DVT)  Date of Provider Response 01/23/23  Time of Provider Response 1730

## 2023-01-23 NOTE — TOC Progression Note (Signed)
Discharge medications (2) are being stored in the main pharmacy on the ground floor until patient is ready for discharge.   

## 2023-01-23 NOTE — TOC Progression Note (Signed)
Transition of Care [facility]) - Progression Note    Patient Details  Name: Timothy Mcdowell MRN: [id number] Date of Birth: February 28, 1971  [facility] of [facility]) CM/SW Contact  Joanne Chars, LCSW Phone Number: 01/23/2023, 10:49 AM  Clinical Narrative:   CSW spoke with pt for update on medicaid transportation.  Pt was able to talk with the [employer] medicaid people, pt reports he is in the system for transportation and has the number to call to schedule a ride 3 days prior to any appt.     Expected Discharge Plan: Home/Self Care Barriers to Discharge: Continued Medical Work up  Expected Discharge Plan and Services   Discharge Planning Services: CM Consult   Living arrangements for the past 2 months: Apartment                                       Social Determinants of Health (SDOH) Interventions SDOH Screenings   Food Insecurity: Food Insecurity Present (01/17/2023)  Housing: High Risk (01/17/2023)  Transportation Needs: Unmet Transportation Needs (01/17/2023)  Utilities: At Risk (01/17/2023)  Depression (PHQ2-9): High Risk (12/29/2022)  Tobacco Use: High Risk (01/17/2023)    Readmission Risk Interventions     No data to display

## 2023-01-23 NOTE — Progress Notes (Signed)
   01/23/23 1057  Mobility  Activity Refused mobility   Mobility Specialist Progress Note  Pt refused mobility d/t his foot hurting. Will f/u as able.    Lucious Groves Mobility Specialist  Please contact via SecureChat or Rehab office at [phone]

## 2023-01-23 NOTE — Progress Notes (Signed)
PROGRESS NOTE Timothy Mcdowell  [id number] DOB: [birth year] DOA: 01/16/2023 PCP: Renee Rival, FNP   Brief Narrative/Hospital Course: 52 y.o. male with medical history significant for essential hypertension, chronic tobacco abuse presented with worsening swelling and pain on his right lower leg, was recently admitted in January for same and was treated with IV antibiotics and discharged home on oral antibiotics but for the past 2 weeks patient noticing worsening of the infection.  He was initially not taking ibuprofen or Keflex as he did not have any money however has since picked up the medication as has been taking for past several days without much relief. In the ED vital signs stable BP tachycardia afebrile labs with leukocytosis metabolic acidosis AKI. EKG sinus rhythm nonspecific T wave changes, chest x-ray no evidence of acute cardiopulmonary process, patient was admitted for further evaluation of sepsis with secondary cellulitis and AKI and anemia. His significant AKI nicely improved with IV fluids and minimizing nephrotoxic medications. Dr. Sharol Given has seen the patient for his foot infection    Subjective: Seen and examined.  Appears tearful this morning had spoken to somebody report he has talked to Education officer, museum regarding his difficult situation  Still having pain, leukocytosis downtrending  Assessment and Plan: Principal Problem:   Cellulitis of right foot Active Problems:   Essential hypertension   Tobacco abuse   Severe sepsis (HCC)   AKI (acute kidney injury) ([place])   Acute anemia  Right lower extremity cellulitis with associated severe sepsis POA Recent admission for right lower extremity first week of January-where MRI was negative for osteo myelitis Injury w/ stepped on a nail in the mid December: X-ray in ED-severe soft tissue edema w/o soft tissue gas or foreign body or osseous changes.  Patient has been having pain and difficulty with tibia-fibula : Finding of  cellulitis, no drainable fluid collection or abscess or evidence of osteomyelitis.  Trace amount of fluid in the muscles area likely reactive due to cellulitis.  Cont  Zyvox and Zosyn for few more days until leukocytosis better given patient's recurrent admission, continue pain management appreciate orthopedic.  Input. Recent Labs  Lab 01/16/23 2345 01/17/23 0124 01/17/23 0422 01/18/23 0322 01/19/23 0344 01/20/23 0155 01/21/23 0331 01/22/23 0436  WBC 26.7*  --    < > 28.9* 31.8* 29.3* 23.3* 18.5*  LATICACIDVEN 1.2 1.0  --   --   --   --   --   --    < > = values in this interval not displayed.   AKI:2/2 dehydration lisinopril ibuprofen use and also with infection.  Resolved.  Recent Labs    12/13/22 0816 12/14/22 0205 12/29/22 1412 01/16/23 2345 01/17/23 0422 01/18/23 0457 01/19/23 0344 01/20/23 0155 01/21/23 0331 01/22/23 0436  BUN 10 15 14 $ 57* 54* 40* 28* 16 12 9  $ CREATININE 1.10 1.02 0.89 5.15* 4.96* 2.85* 1.81* 1.30* [id number]     Metabolic acidosis: In the setting of AKI, resolved Anemia microcytic: Baseline hb ~12g, holding 8--10 gm. Anemia panel shows elevated ferritin iron low. Add Po iron on dc. Recent Labs    12/13/22 0816 12/14/22 0205 01/17/23 0422 01/18/23 0322 01/19/23 0344 01/20/23 0155 01/21/23 0331 01/22/23 0436  HGB 12.4*   < > 10.0* 9.4* 9.1* 9.4* 8.6* 8.4*  MCV 59.6*   < > 57.7* 54.6* 54.5* 55.1* 55.1* 55.2*  FERRITIN 208  --  1,051*  --   --   --   --   --   TIBC  --   --  179*  --   --   --   --   --   IRON  --   --  23*  --   --   --   --   --   RETICCTPCT  --   --  0.6  --   --   --   --   --    < > = values in this interval not displayed.      Essential hypertension: BP well-controlled on amlodipine Sinus tachycardia in the setting of dehydration/pain foot infection, resolved Chronic tobacco abuse: counseled  DVT prophylaxis: heparin injection 5,000 Units Start: 01/17/23 0600 Code Status:   Code Status: Full Code  Family  Communication: plan of care discussed with patient at bedside. Patient status is: Inpatient because of foot infection and AKI Level of care: Telemetry Medical   Dispo: The patient is from: Home            Anticipated disposition:TBD after few more days of IV antibiotics.  TOC consulted due to his homelessness status Objective: Vitals last 24 hrs: Vitals:   01/22/23 0729 01/22/23 1353 01/22/23 2213 01/23/23 0638  BP: (!) 141/86 (!) 146/84 (!) 150/84 (!) 150/86  Pulse: 92 90 90 88  Resp: 17 17 12 $ (!) 24  Temp: 98.9 F (37.2 C) 98.6 F (37 C) 98.6 F (37 C) 98.6 F (37 C)  TempSrc: Oral Oral Oral Oral  SpO2: 94% 97% 98% 97%  Weight:      Height:       Weight change:   Physical Examination: General exam: AAox3, weak,older appearing HEENT:Oral mucosa moist, Ear/Nose WNL grossly, dentition normal. Respiratory system: bilaterally clear BS, no use of accessory muscle Cardiovascular system: S1 & S2 +, regular rate. Gastrointestinal system: Abdomen soft, NT,ND,BS+ Nervous System:Alert, awake, moving extremities and grossly nonfocal Extremities: LE ankle edema +, tender foot right , lower extremities warm Skin: No rashes,no icterus. MSK: Normal muscle bulk,tone, power    Medications reviewed:  Scheduled Meds:  amLODipine  5 mg Oral Daily   heparin  5,000 Units Subcutaneous Q8H   influenza vac split quadrivalent PF  0.5 mL Intramuscular Tomorrow-1000   polyethylene glycol  17 g Oral Daily   senna  1 tablet Oral Daily   Continuous Infusions:   ceFAZolin (ANCEF) IV 2 g (01/23/23 [id number])   lactated ringers 125 mL/hr at 01/21/23 0727   linezolid (ZYVOX) IV 600 mg (01/23/23 [id number])    Diet Order             Diet regular Room service appropriate? Yes; Fluid consistency: Thin  Diet effective now                  Intake/Output Summary (Last 24 hours) at 01/23/2023 0954 Last data filed at 01/23/2023 [id number] Gross per 24 hour  Intake 860 ml  Output 1350 ml  Net -490 ml    Net IO  Since Admission: -3,292.24 mL [01/23/23 0954]  Wt Readings from Last 3 Encounters:  01/19/23 83.2 kg  12/29/22 79.5 kg  12/16/22 78 kg     Unresulted Labs (From admission, onward)     Start     Ordered   01/24/23 0500  CBC  Daily,   R     Question:  Specimen collection method  Answer:  Lab=Lab collect   01/23/23 0608   01/17/23 1447  MRSA Next Gen by PCR, Nasal  ONCE - URGENT,   URGENT        01/17/23  1446          Data Reviewed: I have personally reviewed following labs and imaging studies CBC: Recent Labs  Lab 01/16/23 2345 01/17/23 0422 01/18/23 0322 01/19/23 0344 01/20/23 0155 01/21/23 0331 01/22/23 0436  WBC 26.7* 29.1* 28.9* 31.8* 29.3* 23.3* 18.5*  NEUTROABS 22.8* 27.4*  --   --   --   --   --   HGB 10.6* 10.0* 9.4* 9.1* 9.4* 8.6* 8.4*  HCT 31.5* 30.4* 26.3* 25.6* 27.5* 25.3* 24.6*  MCV 57.2* 57.7* 54.6* 54.5* 55.1* 55.1* 55.2*  PLT 224 195 230 255 277 262 [id number]    Basic Metabolic Panel: Recent Labs  Lab 01/17/23 0124 01/17/23 0422 01/18/23 0457 01/19/23 0344 01/20/23 0155 01/21/23 0331 01/22/23 0436  NA  --  136 138 137 137 134* 132*  K  --  3.7 3.8 3.8 3.9 4.0 3.8  CL  --  103 108 107 107 104 101  CO2  --  17* 18* 20* 21* 22 23  GLUCOSE  --  107* 89 153* 98 110* 100*  BUN  --  54* 40* 28* 16 12 9  $ CREATININE  --  4.96* 2.85* 1.81* 1.30* 1.15 1.08  CALCIUM  --  8.1* 8.4* 8.3* 8.5* 8.0* 7.8*  MG 2.3 2.3  --   --   --   --   --     GFR: Estimated Creatinine Clearance: 82.6 mL/min (by C-G formula based on SCr of 1.08 mg/dL). Liver Function Tests: Recent Labs  Lab 01/16/23 2345 01/17/23 0422  AST 23 23  ALT 23 22  ALKPHOS 87 100  BILITOT 0.7 1.2  PROT 7.0 6.4*  ALBUMIN 2.6* 2.4*    Recent Labs  Lab 01/17/23 0422  INR 1.4*    Recent Labs  Lab 01/16/23 2345 01/17/23 0124  LATICACIDVEN 1.2 1.0     Recent Results (from the past 240 hour(s))  Blood culture (routine x 2)     Status: None   Collection Time: 01/17/23  1:24 AM    Specimen: BLOOD RIGHT ARM  Result Value Ref Range Status   Specimen Description BLOOD RIGHT ARM  Final   Special Requests   Final    BOTTLES DRAWN AEROBIC AND ANAEROBIC Blood Culture results may not be optimal due to an inadequate volume of blood received in culture bottles   Culture   Final    NO GROWTH 5 DAYS Performed at [facility], [place] [address] [place], [place] [address]    Report Status 01/22/2023 FINAL  Final  Blood culture (routine x 2)     Status: None   Collection Time: 01/17/23  1:24 AM   Specimen: BLOOD LEFT ARM  Result Value Ref Range Status   Specimen Description BLOOD LEFT ARM  Final   Special Requests   Final    BOTTLES DRAWN AEROBIC AND ANAEROBIC Blood Culture results may not be optimal due to an inadequate volume of blood received in culture bottles   Culture   Final    NO GROWTH 5 DAYS Performed at [facility], [place] [address], [place], [place] [address]    Report Status 01/22/2023 FINAL  Final    Antimicrobials: Anti-infectives (From admission, onward)    Start     Dose/Rate Route Frequency Ordered Stop   01/21/23 1000  ceFAZolin (ANCEF) IVPB 2g/100 mL premix        2 g 200 mL/hr over 30 Minutes Intravenous Every 8 hours 01/21/23 0945  01/20/23 1819  ceFEPIme (MAXIPIME) 2 g in sodium chloride 0.9 % 100 mL IVPB  Status:  Discontinued        2 g 200 mL/hr over 30 Minutes Intravenous Every 8 hours 01/20/23 1104 01/21/23 0945   01/19/23 1000  ceFEPIme (MAXIPIME) 2 g in sodium chloride 0.9 % 100 mL IVPB  Status:  Discontinued        2 g 200 mL/hr over 30 Minutes Intravenous Every 12 hours 01/19/23 0738 01/20/23 1104   01/17/23 2200  ceFEPIme (MAXIPIME) 2 g in sodium chloride 0.9 % 100 mL IVPB  Status:  Discontinued        2 g 200 mL/hr over 30 Minutes Intravenous Every 24 hours 01/17/23 0245 01/19/23 0738   01/17/23 1600  linezolid (ZYVOX) IVPB 600 mg        600 mg 300 mL/hr over 60 Minutes Intravenous Every 12 hours 01/17/23 1507      01/17/23 0245  vancomycin variable dose per unstable renal function (pharmacist dosing)  Status:  Discontinued         Does not apply See admin instructions 01/17/23 0245 01/17/23 1507   01/17/23 0100  vancomycin (VANCOREADY) IVPB 1500 mg/300 mL        1,500 mg 150 mL/hr over 120 Minutes Intravenous  Once 01/17/23 0057 01/17/23 0354   01/17/23 0100  ceFEPIme (MAXIPIME) 2 g in sodium chloride 0.9 % 100 mL IVPB        2 g 200 mL/hr over 30 Minutes Intravenous  Once 01/17/23 0057 01/17/23 0256      Culture/Microbiology    Component Value Date/Time   SDES BLOOD RIGHT ARM 01/17/2023 0124   SDES BLOOD LEFT ARM 01/17/2023 0124   SPECREQUEST  01/17/2023 0124    BOTTLES DRAWN AEROBIC AND ANAEROBIC Blood Culture results may not be optimal due to an inadequate volume of blood received in culture bottles   SPECREQUEST  01/17/2023 0124    BOTTLES DRAWN AEROBIC AND ANAEROBIC Blood Culture results may not be optimal due to an inadequate volume of blood received in culture bottles   CULT  01/17/2023 0124    NO GROWTH 5 DAYS Performed at [facility], [place] [address], [place],  [place] [address]    CULT  01/17/2023 0124    NO GROWTH 5 DAYS Performed at [place] [address], [place], [place] [address]    REPTSTATUS 01/22/2023 FINAL 01/17/2023 0124   REPTSTATUS 01/22/2023 FINAL 01/17/2023 0124    Radiology Studies: No results found.   LOS: 6 days   Antonieta Pert, MD [facility]  01/23/2023, 9:54 AM

## 2023-01-24 ENCOUNTER — Inpatient Hospital Stay (HOSPITAL_COMMUNITY): Payer: Medicaid Other

## 2023-01-24 DIAGNOSIS — M7989 Other specified soft tissue disorders: Secondary | ICD-10-CM | POA: Diagnosis not present

## 2023-01-24 DIAGNOSIS — L538 Other specified erythematous conditions: Secondary | ICD-10-CM | POA: Diagnosis not present

## 2023-01-24 DIAGNOSIS — R52 Pain, unspecified: Secondary | ICD-10-CM | POA: Diagnosis not present

## 2023-01-24 LAB — BASIC METABOLIC PANEL
Anion gap: 9 (ref 5–15)
BUN: 8 mg/dL (ref 6–20)
CO2: 22 mmol/L (ref 22–32)
Calcium: 8.5 mg/dL — ABNORMAL LOW (ref 8.9–10.3)
Chloride: 102 mmol/L (ref 98–111)
Creatinine, Ser: 0.96 mg/dL (ref 0.61–1.24)
GFR, Estimated: >60 mL/min (ref >60)
Glucose, Bld: 87 mg/dL (ref 70–99)
Potassium: 4.2 mmol/L (ref 3.5–5.1)
Sodium: 133 mmol/L — ABNORMAL LOW (ref 135–145)

## 2023-01-24 LAB — CBC
HCT: 27.7 % — ABNORMAL LOW (ref 39.0–52.0)
Hemoglobin: 9.2 g/dL — ABNORMAL LOW (ref 13.0–17.0)
MCH: 18.8 pg — ABNORMAL LOW (ref 26.0–34.0)
MCHC: 33.2 g/dL (ref 30.0–36.0)
MCV: 56.5 fL — ABNORMAL LOW (ref 80.0–100.0)
Platelets: 287 10*3/uL (ref 150–400)
RBC: 4.9 MIL/uL (ref 4.22–5.81)
RDW: 18.2 % — ABNORMAL HIGH (ref 11.5–15.5)
WBC: 13.9 10*3/uL — ABNORMAL HIGH (ref 4.0–10.5)
nRBC: 0.1 % (ref 0.0–0.2)

## 2023-01-24 NOTE — Plan of Care (Signed)

## 2023-01-24 NOTE — Progress Notes (Signed)
VASCULAR LAB    Left lower extremity venous duplex has been performed.  See CV proc for preliminary results.   Margorie Renner, RVT 01/24/2023, 1:22 PM

## 2023-01-24 NOTE — Progress Notes (Signed)
PROGRESS NOTE Timothy Mcdowell  [id number] DOB: [birth year] DOA: 01/16/2023 PCP: Renee Rival, FNP   Brief Narrative/Hospital Course: 52 y.o. male with medical history significant for essential hypertension, chronic tobacco abuse presented with worsening swelling and pain on his right lower leg, was recently admitted in January for same and was treated with IV antibiotics and discharged home on oral antibiotics but for the past 2 weeks patient noticing worsening of the infection.  He was initially not taking ibuprofen or Keflex as he did not have any money however has since picked up the medication as has been taking for past several days without much relief. In the ED vital signs stable BP tachycardia afebrile labs with leukocytosis metabolic acidosis AKI. EKG sinus rhythm nonspecific T wave changes, chest x-ray no evidence of acute cardiopulmonary process, patient was admitted for further evaluation of sepsis with secondary cellulitis and AKI and anemia. His significant AKI nicely improved with IV fluids and minimizing nephrotoxic medications. Dr. Sharol Given has seen the patient for his foot infection  01/24/2023: Patient seen.  Patient is not keen on being discharged back home.  Patient is on oral antibiotics.     Subjective: Patient seen. -No new complaints.  Assessment and Plan: Principal Problem:   Cellulitis of right foot Active Problems:   Essential hypertension   Tobacco abuse   Severe sepsis (HCC)   AKI (acute kidney injury) ([place])   Acute anemia  Right lower extremity cellulitis with associated severe sepsis POA Recent admission for right lower extremity first week of January-where MRI was negative for osteo myelitis Injury w/ stepped on a nail in the mid December: X-ray in ED-severe soft tissue edema w/o soft tissue gas or foreign body or osseous changes.  Patient has been having pain and difficulty with tibia-fibula : Finding of cellulitis, no drainable fluid collection or  abscess or evidence of osteomyelitis.  Trace amount of fluid in the muscles area likely reactive due to cellulitis.  Cont  Zyvox and Zosyn for few more days until leukocytosis better given patient's recurrent admission, continue pain management appreciate orthopedic.  Input.  01/24/2023: Complete course of antibiotics.  Pursue disposition. Recent Labs  Lab 01/19/23 0344 01/20/23 0155 01/21/23 0331 01/22/23 0436 01/24/23 0332  WBC 31.8* 29.3* 23.3* 18.5* 13.9*   AKI:2/2 dehydration lisinopril ibuprofen use and also with infection.  Resolved.  Recent Labs    12/14/22 0205 12/29/22 1412 01/16/23 2345 01/17/23 0422 01/18/23 0457 01/19/23 0344 01/20/23 0155 01/21/23 0331 01/22/23 0436 01/24/23 0332  BUN 15 14 57* 54* 40* 28* 16 12 9 8  $ CREATININE 1.02 0.89 5.15* 4.96* 2.85* 1.81* 1.30* 1.15 [id number]     Metabolic acidosis: In the setting of AKI, resolved Anemia microcytic: Baseline hb ~12g, holding 8--10 gm. Anemia panel shows elevated ferritin iron low. Add Po iron on dc. Recent Labs    12/13/22 0816 12/14/22 0205 01/17/23 0422 01/18/23 0322 01/19/23 0344 01/20/23 0155 01/21/23 0331 01/22/23 0436 01/24/23 0332  HGB 12.4*   < > 10.0*   < > 9.1* 9.4* 8.6* 8.4* 9.2*  MCV 59.6*   < > 57.7*   < > 54.5* 55.1* 55.1* 55.2* 56.5*  FERRITIN 208  --  1,051*  --   --   --   --   --   --   TIBC  --   --  179*  --   --   --   --   --   --   IRON  --   --  23*  --   --   --   --   --   --   RETICCTPCT  --   --  0.6  --   --   --   --   --   --    < > = values in this interval not displayed.      Essential hypertension:  -Systolic blood pressure in the 150s. -Goal blood pressure should be less than 130/80 mmHg. -Continue to monitor and manage accordingly.    Sinus tachycardia in the setting of dehydration/pain foot infection, resolved Chronic tobacco abuse: counseled  DVT prophylaxis: heparin injection 5,000 Units Start: 01/17/23 0600 Code Status:   Code Status: Full  Code  Family Communication:  Patient status is: Inpatient because of foot infection and AKI Level of care: Telemetry Medical   Dispo: The patient is from: Home            Anticipated disposition:TBD after few more days of IV antibiotics.  TOC consulted due to his homelessness status Objective: Vitals last 24 hrs: Vitals:   01/23/23 2218 01/24/23 0500 01/24/23 0650 01/24/23 0716  BP: (!) 147/81  (!) 144/80   Pulse: 91  84   Resp: 18  16   Temp: 98.5 F (36.9 C)  98.5 F (36.9 C)   TempSrc: Oral  Oral   SpO2: 96%  99% 100%  Weight:  80.2 kg    Height:       Weight change:   Physical Examination: General exam: Awake and alert.  Not in any distress. HEENT: Patient is pale. Respiratory system: Clear to auscultation.   Cardiovascular system: S1 & S2  Gastrointestinal system: Abdomen soft, NT,ND,BS+ Nervous System:Alert, awake, moving extremities and grossly nonfocal Extremities: Redness of the skin of lower extremities, with chronic skin changes.       Medications reviewed:  Scheduled Meds:  amLODipine  5 mg Oral Daily   cephALEXin  500 mg Oral Q8H   heparin  5,000 Units Subcutaneous Q8H   linezolid  600 mg Oral Q12H   polyethylene glycol  17 g Oral Daily   senna  1 tablet Oral Daily   Continuous Infusions:    Diet Order             Diet regular Room service appropriate? Yes; Fluid consistency: Thin  Diet effective now                  Intake/Output Summary (Last 24 hours) at 01/24/2023 1349 Last data filed at 01/24/2023 0656 Gross per 24 hour  Intake 690 ml  Output 3175 ml  Net -2485 ml    Net IO Since Admission: -5,959.24 mL [01/24/23 1349]  Wt Readings from Last 3 Encounters:  01/24/23 80.2 kg  12/29/22 79.5 kg  12/16/22 78 kg     Unresulted Labs (From admission, onward)     Start     Ordered   01/24/23 0500  CBC  Daily,   R     Question:  Specimen collection method  Answer:  Lab=Lab collect   01/23/23 0608   01/24/23 [id number]  Basic metabolic  panel  Daily,   R     Question:  Specimen collection method  Answer:  Lab=Lab collect   01/23/23 0955          Data Reviewed: I have personally reviewed following labs and imaging studies CBC: Recent Labs  Lab 01/19/23 0344 01/20/23 0155 01/21/23 0331 01/22/23 0436 01/24/23 0332  WBC 31.8* 29.3* 23.3*  18.5* 13.9*  HGB 9.1* 9.4* 8.6* 8.4* 9.2*  HCT 25.6* 27.5* 25.3* 24.6* 27.7*  MCV 54.5* 55.1* 55.1* 55.2* 56.5*  PLT 255 277 262 236 [id number]    Basic Metabolic Panel: Recent Labs  Lab 01/19/23 0344 01/20/23 0155 01/21/23 0331 01/22/23 0436 01/24/23 0332  NA 137 137 134* 132* 133*  K 3.8 3.9 4.0 3.8 4.2  CL 107 107 104 101 102  CO2 20* 21* 22 23 22  $ GLUCOSE 153* 98 110* 100* 87  BUN 28* 16 12 9 8  $ CREATININE 1.81* 1.30* 1.15 1.08 0.96  CALCIUM 8.3* 8.5* 8.0* 7.8* 8.5*    GFR: Estimated Creatinine Clearance: 92.9 mL/min (by C-G formula based on SCr of 0.96 mg/dL). Liver Function Tests: No results for input(s): "AST", "ALT", "ALKPHOS", "BILITOT", "PROT", "ALBUMIN" in the last 168 hours.  No results for input(s): "INR", "PROTIME" in the last 168 hours.  No results for input(s): "PROCALCITON", "LATICACIDVEN" in the last 168 hours.   Recent Results (from the past 240 hour(s))  Blood culture (routine x 2)     Status: None   Collection Time: 01/17/23  1:24 AM   Specimen: BLOOD RIGHT ARM  Result Value Ref Range Status   Specimen Description BLOOD RIGHT ARM  Final   Special Requests   Final    BOTTLES DRAWN AEROBIC AND ANAEROBIC Blood Culture results may not be optimal due to an inadequate volume of blood received in culture bottles   Culture   Final    NO GROWTH 5 DAYS Performed at [facility], [place] [address], [place], [place] [address]    Report Status 01/22/2023 FINAL  Final  Blood culture (routine x 2)     Status: None   Collection Time: 01/17/23  1:24 AM   Specimen: BLOOD LEFT ARM  Result Value Ref Range Status   Specimen Description BLOOD LEFT ARM   Final   Special Requests   Final    BOTTLES DRAWN AEROBIC AND ANAEROBIC Blood Culture results may not be optimal due to an inadequate volume of blood received in culture bottles   Culture   Final    NO GROWTH 5 DAYS Performed at [facility], [place] [address], [place], [place] [address]    Report Status 01/22/2023 FINAL  Final    Antimicrobials: Anti-infectives (From admission, onward)    Start     Dose/Rate Route Frequency Ordered Stop   01/24/23 1000  linezolid (ZYVOX) tablet 600 mg        600 mg Oral Every 12 hours 01/23/23 1457 01/27/23 0959   01/24/23 0600  cephALEXin (KEFLEX) capsule 500 mg        500 mg Oral Every 8 hours 01/23/23 1457 01/27/23 0559   01/24/23 0000  linezolid (ZYVOX) 600 MG tablet        600 mg Oral Every 12 hours 01/23/23 1457 01/27/23 2359   01/23/23 0000  cephALEXin (KEFLEX) 500 MG capsule        500 mg Oral 3 times daily 01/23/23 1457 01/26/23 2359   01/21/23 1000  ceFAZolin (ANCEF) IVPB 2g/100 mL premix        2 g 200 mL/hr over 30 Minutes Intravenous Every 8 hours 01/21/23 0945 01/23/23 2254   01/20/23 1819  ceFEPIme (MAXIPIME) 2 g in sodium chloride 0.9 % 100 mL IVPB  Status:  Discontinued        2 g 200 mL/hr over 30 Minutes Intravenous Every 8 hours 01/20/23 1104 01/21/23 0945  01/19/23 1000  ceFEPIme (MAXIPIME) 2 g in sodium chloride 0.9 % 100 mL IVPB  Status:  Discontinued        2 g 200 mL/hr over 30 Minutes Intravenous Every 12 hours 01/19/23 0738 01/20/23 1104   01/17/23 2200  ceFEPIme (MAXIPIME) 2 g in sodium chloride 0.9 % 100 mL IVPB  Status:  Discontinued        2 g 200 mL/hr over 30 Minutes Intravenous Every 24 hours 01/17/23 0245 01/19/23 0738   01/17/23 1600  linezolid (ZYVOX) IVPB 600 mg        600 mg 300 mL/hr over 60 Minutes Intravenous Every 12 hours 01/17/23 1507 01/23/23 2359   01/17/23 0245  vancomycin variable dose per unstable renal function (pharmacist dosing)  Status:  Discontinued         Does not apply See admin  instructions 01/17/23 0245 01/17/23 1507   01/17/23 0100  vancomycin (VANCOREADY) IVPB 1500 mg/300 mL        1,500 mg 150 mL/hr over 120 Minutes Intravenous  Once 01/17/23 0057 01/17/23 0354   01/17/23 0100  ceFEPIme (MAXIPIME) 2 g in sodium chloride 0.9 % 100 mL IVPB        2 g 200 mL/hr over 30 Minutes Intravenous  Once 01/17/23 0057 01/17/23 0256      Culture/Microbiology    Component Value Date/Time   SDES BLOOD RIGHT ARM 01/17/2023 0124   SDES BLOOD LEFT ARM 01/17/2023 0124   SPECREQUEST  01/17/2023 0124    BOTTLES DRAWN AEROBIC AND ANAEROBIC Blood Culture results may not be optimal due to an inadequate volume of blood received in culture bottles   SPECREQUEST  01/17/2023 0124    BOTTLES DRAWN AEROBIC AND ANAEROBIC Blood Culture results may not be optimal due to an inadequate volume of blood received in culture bottles   CULT  01/17/2023 0124    NO GROWTH 5 DAYS Performed at [facility], [place] [address], [place], [place] [address]    CULT  01/17/2023 0124    NO GROWTH 5 DAYS Performed at [place] [address], [place], [place] [address]    REPTSTATUS 01/22/2023 FINAL 01/17/2023 0124   REPTSTATUS 01/22/2023 FINAL 01/17/2023 0124    Radiology Studies: No results found.   LOS: 7 days   Bonnell Public, MD [facility]  01/24/2023, 1:49 PM

## 2023-01-25 LAB — BASIC METABOLIC PANEL
Anion gap: 11 (ref 5–15)
BUN: 13 mg/dL (ref 6–20)
CO2: 23 mmol/L (ref 22–32)
Calcium: 8.6 mg/dL — ABNORMAL LOW (ref 8.9–10.3)
Chloride: 100 mmol/L (ref 98–111)
Creatinine, Ser: 0.98 mg/dL (ref 0.61–1.24)
GFR, Estimated: >60 mL/min (ref >60)
Glucose, Bld: 106 mg/dL — ABNORMAL HIGH (ref 70–99)
Potassium: 4.4 mmol/L (ref 3.5–5.1)
Sodium: 134 mmol/L — ABNORMAL LOW (ref 135–145)

## 2023-01-25 LAB — CBC
HCT: 26.1 % — ABNORMAL LOW (ref 39.0–52.0)
Hemoglobin: 8.9 g/dL — ABNORMAL LOW (ref 13.0–17.0)
MCH: 18.9 pg — ABNORMAL LOW (ref 26.0–34.0)
MCHC: 34.1 g/dL (ref 30.0–36.0)
MCV: 55.3 fL — ABNORMAL LOW (ref 80.0–100.0)
Platelets: 340 10*3/uL (ref 150–400)
RBC: 4.72 MIL/uL (ref 4.22–5.81)
RDW: 17.3 % — ABNORMAL HIGH (ref 11.5–15.5)
WBC: 11.6 10*3/uL — ABNORMAL HIGH (ref 4.0–10.5)
nRBC: 0 % (ref 0.0–0.2)

## 2023-01-25 MED ORDER — FLEET ENEMA 7-19 GM/118ML RE ENEM
1.0000 | ENEMA | Freq: Once | RECTAL | Status: AC | PRN
  Administered 2023-01-25: 1 via RECTAL
  Filled 2023-01-25: qty 1

## 2023-01-25 NOTE — Progress Notes (Signed)
PROGRESS NOTE Timothy Mcdowell  [id number] DOB: 18-Apr-1971 DOA: 01/16/2023 PCP: Renee Rival, FNP   Brief Narrative/Hospital Course: 52 y.o. male with medical history significant for essential hypertension, chronic tobacco abuse presented with worsening swelling and pain on his right lower leg, was recently admitted in January for same and was treated with IV antibiotics and discharged home on oral antibiotics but for the past 2 weeks patient noticing worsening of the infection.  He was initially not taking ibuprofen or Keflex as he did not have any money however has since picked up the medication as has been taking for past several days without much relief. In the ED vital signs stable BP tachycardia afebrile labs with leukocytosis metabolic acidosis AKI. EKG sinus rhythm nonspecific T wave changes, chest x-ray no evidence of acute cardiopulmonary process, patient was admitted for further evaluation of sepsis with secondary cellulitis and AKI and anemia. His significant AKI nicely improved with IV fluids and minimizing nephrotoxic medications. Dr. Sharol Given has seen the patient for his foot infection  01/25/2023: Patient seen.  Patient is not keen on being discharged back home.  Patient is on oral antibiotics.     Subjective: Patient seen. -No new complaints.  Assessment and Plan: Principal Problem:   Cellulitis of right foot Active Problems:   Essential hypertension   Tobacco abuse   Severe sepsis (HCC)   AKI (acute kidney injury) ([place])   Acute anemia  Right lower extremity cellulitis with associated severe sepsis POA: -Recent admission for right lower extremity first week of January. -MRI was negative for osteo myelitis -Injury w/ stepped on a nail in the mid December: -X-ray in ED revealed severe soft tissue edema w/o soft tissue gas or foreign body or osseous changes.  Patient has been having pain and difficulty with tibia-fibula : Finding of cellulitis, no drainable fluid  collection or abscess or evidence of osteomyelitis.  Trace amount of fluid in the muscles area likely reactive due to cellulitis.   -Patient is currently on Zyvox 600 Mg p.o. twice daily and Keflex 500 Mg p.o. 3 times daily. -Patient is not keen on being discharged. -Orthopedic input is appreciated. -Currently consult TOC tomorrow.  Recent Labs  Lab 01/20/23 0155 01/21/23 0331 01/22/23 0436 01/24/23 0332 01/25/23 0416  WBC 29.3* 23.3* 18.5* 13.9* 11.6*   AKI:2/2 dehydration lisinopril ibuprofen use and also with infection.  Resolved.  Recent Labs    12/29/22 1412 01/16/23 2345 01/17/23 0422 01/18/23 0457 01/19/23 0344 01/20/23 0155 01/21/23 0331 01/22/23 0436 01/24/23 0332 01/25/23 0416  BUN 14 57* 54* 40* 28* 16 12 9 8 13  $ CREATININE 0.89 5.15* 4.96* 2.85* 1.81* 1.30* 1.15 1.08 [id number]     Metabolic acidosis: -In the setting of AKI -Resolved  Anemia, microcytic: -Baseline hemoglobin of 12 g/dL. -Iron panel revealed iron of 23, TSAT of 13%, ferritin of 1051 -Hemoglobin is 8.9 g/dL today.   -Consider iron when infection resolves. . Recent Labs    12/13/22 [id number] 12/14/22 0205 01/17/23 0422 01/18/23 [id number] 01/20/23 0155 01/21/23 0331 01/22/23 0436 01/24/23 0332 01/25/23 0416  HGB 12.4*   < > 10.0*   < > 9.4* 8.6* 8.4* 9.2* 8.9*  MCV 59.6*   < > 57.7*   < > 55.1* 55.1* 55.2* 56.5* 55.3*  FERRITIN 208  --  1,051*  --   --   --   --   --   --   TIBC  --   --  179*  --   --   --   --   --   --  IRON  --   --  23*  --   --   --   --   --   --   RETICCTPCT  --   --  0.6  --   --   --   --   --   --    < > = values in this interval not displayed.      Essential hypertension:  -Blood pressure is better controlled today.   -Goal blood pressure should be less than 130/80 mmHg. -Continue to monitor and manage accordingly.    Sinus tachycardia: -Resolved. -In the setting of dehydration/pain foot infection.  Chronic tobacco abuse: -Counseled  DVT prophylaxis:  heparin injection 5,000 Units Start: 01/17/23 0600 Code Status:   Code Status: Full Code  Family Communication:  Patient status is: Inpatient because of foot infection and AKI Level of care: Telemetry Medical   Dispo: The patient is from: Home            Anticipated disposition:TBD after few more days of IV antibiotics.  TOC consulted due to his homelessness status  Objective: Vitals last 24 hrs: Vitals:   01/25/23 0559 01/25/23 0719 01/25/23 0803 01/25/23 1421  BP: 136/85  (!) 154/77 130/79  Pulse: 88  85 86  Resp: 18  17 17  $ Temp: 98.2 F (36.8 C)  (!) 97.4 F (36.3 C) 97.7 F (36.5 C)  TempSrc: Oral  Oral Oral  SpO2: 100% 100% 100% 100%  Weight:      Height:       Weight change:   Physical Examination: General exam: Awake and alert.  Not in any distress. HEENT: Patient is pale. Respiratory system: Clear to auscultation.   Cardiovascular system: S1 & S2  Gastrointestinal system: Abdomen soft, NT,ND,BS+ Nervous System:Alert, awake, moving extremities and grossly nonfocal Extremities: Redness of the skin of lower extremities, with chronic skin changes.       Medications reviewed:  Scheduled Meds:  amLODipine  5 mg Oral Daily   cephALEXin  500 mg Oral Q8H   heparin  5,000 Units Subcutaneous Q8H   linezolid  600 mg Oral Q12H   polyethylene glycol  17 g Oral Daily   senna  1 tablet Oral Daily   Continuous Infusions:    Diet Order             Diet regular Room service appropriate? Yes; Fluid consistency: Thin  Diet effective now                  Intake/Output Summary (Last 24 hours) at 01/25/2023 1722 Last data filed at 01/25/2023 1500 Gross per 24 hour  Intake 960 ml  Output 2350 ml  Net -1390 ml    Net IO Since Admission: -7,349.24 mL [01/25/23 1722]  Wt Readings from Last 3 Encounters:  01/24/23 80.2 kg  12/29/22 79.5 kg  12/16/22 78 kg     Unresulted Labs (From admission, onward)     Start     Ordered   01/24/23 0500  CBC  Daily,   R      Question:  Specimen collection method  Answer:  Lab=Lab collect   01/23/23 0608   01/24/23 [id number]  Basic metabolic panel  Daily,   R     Question:  Specimen collection method  Answer:  Lab=Lab collect   01/23/23 0955          Data Reviewed: I have personally reviewed following labs and imaging studies CBC: Recent Labs  Lab 01/20/23 0155  01/21/23 0331 01/22/23 0436 01/24/23 0332 01/25/23 0416  WBC 29.3* 23.3* 18.5* 13.9* 11.6*  HGB 9.4* 8.6* 8.4* 9.2* 8.9*  HCT 27.5* 25.3* 24.6* 27.7* 26.1*  MCV 55.1* 55.1* 55.2* 56.5* 55.3*  PLT 277 262 236 287 [id number]    Basic Metabolic Panel: Recent Labs  Lab 01/20/23 0155 01/21/23 0331 01/22/23 0436 01/24/23 0332 01/25/23 0416  NA 137 134* 132* 133* 134*  K 3.9 4.0 3.8 4.2 4.4  CL 107 104 101 102 100  CO2 21* 22 23 22 23  $ GLUCOSE 98 110* 100* 87 106*  BUN 16 12 9 8 13  $ CREATININE 1.30* 1.15 1.08 0.96 0.98  CALCIUM 8.5* 8.0* 7.8* 8.5* 8.6*    GFR: Estimated Creatinine Clearance: 91 mL/min (by C-G formula based on SCr of 0.98 mg/dL). Liver Function Tests: No results for input(s): "AST", "ALT", "ALKPHOS", "BILITOT", "PROT", "ALBUMIN" in the last 168 hours.  No results for input(s): "INR", "PROTIME" in the last 168 hours.  No results for input(s): "PROCALCITON", "LATICACIDVEN" in the last 168 hours.   Recent Results (from the past 240 hour(s))  Blood culture (routine x 2)     Status: None   Collection Time: 01/17/23  1:24 AM   Specimen: BLOOD RIGHT ARM  Result Value Ref Range Status   Specimen Description BLOOD RIGHT ARM  Final   Special Requests   Final    BOTTLES DRAWN AEROBIC AND ANAEROBIC Blood Culture results may not be optimal due to an inadequate volume of blood received in culture bottles   Culture   Final    NO GROWTH 5 DAYS Performed at [facility], [place] [address] [place], [place] [address]    Report Status 01/22/2023 FINAL  Final  Blood culture (routine x 2)     Status: None   Collection Time: 01/17/23   1:24 AM   Specimen: BLOOD LEFT ARM  Result Value Ref Range Status   Specimen Description BLOOD LEFT ARM  Final   Special Requests   Final    BOTTLES DRAWN AEROBIC AND ANAEROBIC Blood Culture results may not be optimal due to an inadequate volume of blood received in culture bottles   Culture   Final    NO GROWTH 5 DAYS Performed at [facility], [place] [address] [place], [place] [address]    Report Status 01/22/2023 FINAL  Final    Antimicrobials: Anti-infectives (From admission, onward)    Start     Dose/Rate Route Frequency Ordered Stop   01/24/23 1000  linezolid (ZYVOX) tablet 600 mg        600 mg Oral Every 12 hours 01/23/23 1457 01/27/23 0959   01/24/23 0600  cephALEXin (KEFLEX) capsule 500 mg        500 mg Oral Every 8 hours 01/23/23 1457 01/27/23 0559   01/24/23 0000  linezolid (ZYVOX) 600 MG tablet        600 mg Oral Every 12 hours 01/23/23 1457 01/27/23 2359   01/23/23 0000  cephALEXin (KEFLEX) 500 MG capsule        500 mg Oral 3 times daily 01/23/23 1457 01/26/23 2359   01/21/23 1000  ceFAZolin (ANCEF) IVPB 2g/100 mL premix        2 g 200 mL/hr over 30 Minutes Intravenous Every 8 hours 01/21/23 0945 01/23/23 2254   01/20/23 1819  ceFEPIme (MAXIPIME) 2 g in sodium chloride 0.9 % 100 mL IVPB  Status:  Discontinued        2 g 200  mL/hr over 30 Minutes Intravenous Every 8 hours 01/20/23 1104 01/21/23 0945   01/19/23 1000  ceFEPIme (MAXIPIME) 2 g in sodium chloride 0.9 % 100 mL IVPB  Status:  Discontinued        2 g 200 mL/hr over 30 Minutes Intravenous Every 12 hours 01/19/23 0738 01/20/23 1104   01/17/23 2200  ceFEPIme (MAXIPIME) 2 g in sodium chloride 0.9 % 100 mL IVPB  Status:  Discontinued        2 g 200 mL/hr over 30 Minutes Intravenous Every 24 hours 01/17/23 0245 01/19/23 0738   01/17/23 1600  linezolid (ZYVOX) IVPB 600 mg        600 mg 300 mL/hr over 60 Minutes Intravenous Every 12 hours 01/17/23 1507 01/23/23 2359   01/17/23 0245  vancomycin variable dose  per unstable renal function (pharmacist dosing)  Status:  Discontinued         Does not apply See admin instructions 01/17/23 0245 01/17/23 1507   01/17/23 0100  vancomycin (VANCOREADY) IVPB 1500 mg/300 mL        1,500 mg 150 mL/hr over 120 Minutes Intravenous  Once 01/17/23 0057 01/17/23 0354   01/17/23 0100  ceFEPIme (MAXIPIME) 2 g in sodium chloride 0.9 % 100 mL IVPB        2 g 200 mL/hr over 30 Minutes Intravenous  Once 01/17/23 0057 01/17/23 0256      Culture/Microbiology    Component Value Date/Time   SDES BLOOD RIGHT ARM 01/17/2023 0124   SDES BLOOD LEFT ARM 01/17/2023 0124   SPECREQUEST  01/17/2023 0124    BOTTLES DRAWN AEROBIC AND ANAEROBIC Blood Culture results may not be optimal due to an inadequate volume of blood received in culture bottles   SPECREQUEST  01/17/2023 0124    BOTTLES DRAWN AEROBIC AND ANAEROBIC Blood Culture results may not be optimal due to an inadequate volume of blood received in culture bottles   CULT  01/17/2023 0124    NO GROWTH 5 DAYS Performed at [facility], [place] [address] [place], [place] [address]    CULT  01/17/2023 0124    NO GROWTH 5 DAYS Performed at [place] [address], [place], [place] [address]    REPTSTATUS 01/22/2023 FINAL 01/17/2023 0124   REPTSTATUS 01/22/2023 FINAL 01/17/2023 0124    Radiology Studies: VAS [place] LOWER EXTREMITY VENOUS (DVT)  Result Date: 01/24/2023  Lower Venous DVT Study Patient Name:  HAMSA BRACKMAN  Date of Exam:   01/24/2023 Medical Rec #: [id number]       Accession #:    [id number] Date of Birth: 09-08-1971       Patient Gender: M Patient Age:   38 years Exam Location:  [facility] Procedure:      VAS [place] LOWER EXTREMITY VENOUS (DVT) Referring Phys: [place] --------------------------------------------------------------------------------  Indications: Pain, Swelling, and Erythema.  Comparison Study: No prior study on file Performing Technologist: Sharion Dove RVS  Examination  Guidelines: A complete evaluation includes B-mode imaging, spectral Doppler, color Doppler, and power Doppler as needed of all accessible portions of each vessel. Bilateral testing is considered an integral part of a complete examination. Limited examinations for reoccurring indications may be performed as noted. The reflux portion of the exam is performed with the patient in reverse Trendelenburg.  +-----+---------------+---------+-----------+----------+--------------+ RIGHTCompressibilityPhasicitySpontaneityPropertiesThrombus Aging +-----+---------------+---------+-----------+----------+--------------+ CFV  Full           Yes      Yes                                 +-----+---------------+---------+-----------+----------+--------------+   +---------+---------------+---------+-----------+----------+--------------+  LEFT     CompressibilityPhasicitySpontaneityPropertiesThrombus Aging +---------+---------------+---------+-----------+----------+--------------+ CFV      Full           Yes      Yes                                 +---------+---------------+---------+-----------+----------+--------------+ SFJ      Full                                                        +---------+---------------+---------+-----------+----------+--------------+ FV Prox  Full                                                        +---------+---------------+---------+-----------+----------+--------------+ FV Mid   Full                                                        +---------+---------------+---------+-----------+----------+--------------+ FV DistalFull                                                        +---------+---------------+---------+-----------+----------+--------------+ PFV      Full                                                        +---------+---------------+---------+-----------+----------+--------------+ POP      Full           Yes      Yes                                  +---------+---------------+---------+-----------+----------+--------------+ PTV      Full                                                        +---------+---------------+---------+-----------+----------+--------------+ PERO     Full                                                        +---------+---------------+---------+-----------+----------+--------------+     Summary: RIGHT: - No evidence of common femoral vein obstruction. - Ultrasound characteristics of enlarged lymph nodes are noted in the groin.  LEFT: - There is no evidence of deep vein thrombosis in the lower extremity.  - No cystic structure found  in the popliteal fossa. - Ultrasound characteristics of enlarged lymph nodes noted in the groin.  *See table(s) above for measurements and observations. Electronically signed by Deitra Mayo MD on 01/24/2023 at 6:49:43 PM.    Final      LOS: 8 days   Bonnell Public, MD [facility]  01/25/2023, 5:22 PM

## 2023-01-26 ENCOUNTER — Other Ambulatory Visit (HOSPITAL_COMMUNITY): Payer: Self-pay

## 2023-01-26 LAB — CBC
HCT: 29.2 % — ABNORMAL LOW (ref 39.0–52.0)
Hemoglobin: 9.8 g/dL — ABNORMAL LOW (ref 13.0–17.0)
MCH: 18.8 pg — ABNORMAL LOW (ref 26.0–34.0)
MCHC: 33.6 g/dL (ref 30.0–36.0)
MCV: 56.2 fL — ABNORMAL LOW (ref 80.0–100.0)
Platelets: 427 10*3/uL — ABNORMAL HIGH (ref 150–400)
RBC: 5.2 MIL/uL (ref 4.22–5.81)
RDW: 18.8 % — ABNORMAL HIGH (ref 11.5–15.5)
WBC: 13.1 10*3/uL — ABNORMAL HIGH (ref 4.0–10.5)
nRBC: 0 % (ref 0.0–0.2)

## 2023-01-26 LAB — BASIC METABOLIC PANEL
Anion gap: 9 (ref 5–15)
BUN: 13 mg/dL (ref 6–20)
CO2: 23 mmol/L (ref 22–32)
Calcium: 8.9 mg/dL (ref 8.9–10.3)
Chloride: 101 mmol/L (ref 98–111)
Creatinine, Ser: 1.04 mg/dL (ref 0.61–1.24)
GFR, Estimated: >60 mL/min (ref >60)
Glucose, Bld: 97 mg/dL (ref 70–99)
Potassium: 4.5 mmol/L (ref 3.5–5.1)
Sodium: 133 mmol/L — ABNORMAL LOW (ref 135–145)

## 2023-01-26 NOTE — Progress Notes (Signed)
PROGRESS NOTE  Timothy Mcdowell  DOB: 1971-04-17  PCP: Renee Rival, FNP [id number]  DOA: 01/16/2023  LOS: 9 days  Hospital Day: 11  Brief narrative: Timothy Mcdowell is a 52 y.o. male with PMH significant for essential hypertension, chronic smoking 2/9, patient presented to the ED with complaint of progressively worsening swelling and pain of the right lower leg for 2 weeks.   Patient states that in December, he stepped over nail.  The puncture wound got worse and he presented to the ED on 1/5.  X-ray and MRI at the time showed cellulitis without osteomyelitis.  Patient was treated with IV antibiotics and discharged home on oral antibiotics.  Despite completion of course, pain persisted and progressively worsened hence patient returned back to ED on 2/9.  In the ED patient was afebrile, tachycardic mildly Labs showed leukocytosis, AKI and metabolic acidosis X-ray showed cellulitis and no evidence of osteomyelitis. Patient was admitted with sepsis secondary to cellulitis along with AKI and anemia. 2/13, MRI right foot was obtained which again ruled out osteomyelitis or drainable abscess. Seen by Dr. Sharol Given 2/14.  No surgical intervention recommended. See below for details.  Subjective: Patient was seen and examined this morning. Lying on bed.  States he is still hurting.  He is very visibly aggressive in his tone while talking about his pain. He keeps on repeating that he believes he was discharged prematurely last time and does not feel ready for discharge at this time. Chart reviewed In the last 24 hours, no fever, heart rate 90s, blood pressure 150s Last set of labs this morning with sodium 133, WBC count 13.1, hemoglobin 9.8  Assessment and plan: Severe sepsis secondary to right lower extremity cellulitis - POA No evidence of osteomyelitis and imagings as above. Seen by Dr. Sharol Given.  No need of surgical intervention. Currently on oral Zyvox 600 Mg p.o. twice daily and Keflex 500  Mg p.o. 3 times daily. WBC count improving as below. Patient is not keen on being discharged.Marland Kitchen  He states that he continues to have pain and does not feel ready for discharge yet. On exam, he seems to have chronic skin changes.  I suspect peripheral artery disease.  Will obtain ABI. Continue pain control with Norco as needed. Recent Labs  Lab 01/21/23 0331 01/22/23 0436 01/24/23 0332 01/25/23 0416 01/26/23 0456  WBC 23.3* 18.5* 13.9* 11.6* 13.1*   AKI Acute metabolic acidosis Present with creatinine significantly elevated to 5.15 in the setting of infection..  Creatinine down trended to normal. Serum bicarb level improved to normal as well. Recent Labs    01/16/23 2345 01/17/23 0422 01/18/23 0457 01/19/23 0344 01/20/23 0155 01/21/23 0331 01/22/23 0436 01/24/23 0332 01/25/23 0416 01/26/23 0456  BUN 57* 54* 40* 28* 16 12 9 8 13 13  $ CREATININE 5.15* 4.96* 2.85* 1.81* 1.30* 1.15 1.08 0.96 0.98 1.04  CO2 20* 17* 18* 20* 21* 22 23 22 23 23     $ Chronic microcytic anemia Ferritin level adequate but significant microcytosis noted.  Probably thalassemia trait. No active bleeding.  Hemoglobin currently stable between 9 and 10. Recent Labs    12/13/22 0816 12/14/22 0205 01/17/23 0422 01/18/23 0322 01/21/23 0331 01/22/23 0436 01/24/23 0332 01/25/23 0416 01/26/23 0456  HGB 12.4*   < > 10.0*   < > 8.6* 8.4* 9.2* 8.9* 9.8*  MCV 59.6*   < > 57.7*   < > 55.1* 55.2* 56.5* 55.3* 56.2*  FERRITIN 208  --  1,051*  --   --   --   --   --   --  TIBC  --   --  179*  --   --   --   --   --   --   IRON  --   --  23*  --   --   --   --   --   --   RETICCTPCT  --   --  0.6  --   --   --   --   --   --    < > = values in this interval not displayed.   Essential hypertension Blood pressure is currently controlled on amlodipine 5 mg daily.  Lisinopril on hold. Continue to monitor blood pressure.  Hydralazine IV as needed.  Chronic tobacco abuse Counseled to quit.  Nicotine patch  offered.   Mobility: Encourage ambulation  Goals of care   Code Status: Full Code     DVT prophylaxis:  heparin injection 5,000 Units Start: 01/17/23 0600   Antimicrobials: Oral Zyvox and Keflex Fluid: None Consultants: None at this time Family Communication: None at bedside  Status is: Inpatient Level of care: Telemetry Medical   Dispo: Patient is from: Home              Anticipated d/c is to: Pending clinical course Continue in-hospital care because: Pending ABI   Scheduled Meds:  amLODipine  5 mg Oral Daily   cephALEXin  500 mg Oral Q8H   heparin  5,000 Units Subcutaneous Q8H   linezolid  600 mg Oral Q12H   polyethylene glycol  17 g Oral Daily   senna  1 tablet Oral Daily    PRN meds: acetaminophen **OR** acetaminophen, HYDROcodone-acetaminophen, HYDROmorphone (DILAUDID) injection, melatonin, naLOXone (NARCAN)  injection, nicotine, ondansetron (ZOFRAN) IV   Infusions:    Diet:  Diet Order             Diet regular Room service appropriate? Yes; Fluid consistency: Thin  Diet effective now                   Antimicrobials: Anti-infectives (From admission, onward)    Start     Dose/Rate Route Frequency Ordered Stop   01/24/23 1000  linezolid (ZYVOX) tablet 600 mg        600 mg Oral Every 12 hours 01/23/23 1457 01/27/23 0959   01/24/23 0600  cephALEXin (KEFLEX) capsule 500 mg        500 mg Oral Every 8 hours 01/23/23 1457 01/27/23 0559   01/24/23 0000  linezolid (ZYVOX) 600 MG tablet        600 mg Oral Every 12 hours 01/23/23 1457 01/27/23 2359   01/23/23 0000  cephALEXin (KEFLEX) 500 MG capsule        500 mg Oral 3 times daily 01/23/23 1457 01/26/23 2359   01/21/23 1000  ceFAZolin (ANCEF) IVPB 2g/100 mL premix        2 g 200 mL/hr over 30 Minutes Intravenous Every 8 hours 01/21/23 0945 01/23/23 2254   01/20/23 1819  ceFEPIme (MAXIPIME) 2 g in sodium chloride 0.9 % 100 mL IVPB  Status:  Discontinued        2 g 200 mL/hr over 30 Minutes Intravenous  Every 8 hours 01/20/23 1104 01/21/23 0945   01/19/23 1000  ceFEPIme (MAXIPIME) 2 g in sodium chloride 0.9 % 100 mL IVPB  Status:  Discontinued        2 g 200 mL/hr over 30 Minutes Intravenous Every 12 hours 01/19/23 0738 01/20/23 1104   01/17/23 2200  ceFEPIme (MAXIPIME)  2 g in sodium chloride 0.9 % 100 mL IVPB  Status:  Discontinued        2 g 200 mL/hr over 30 Minutes Intravenous Every 24 hours 01/17/23 0245 01/19/23 0738   01/17/23 1600  linezolid (ZYVOX) IVPB 600 mg        600 mg 300 mL/hr over 60 Minutes Intravenous Every 12 hours 01/17/23 1507 01/23/23 2359   01/17/23 0245  vancomycin variable dose per unstable renal function (pharmacist dosing)  Status:  Discontinued         Does not apply See admin instructions 01/17/23 0245 01/17/23 1507   01/17/23 0100  vancomycin (VANCOREADY) IVPB 1500 mg/300 mL        1,500 mg 150 mL/hr over 120 Minutes Intravenous  Once 01/17/23 0057 01/17/23 0354   01/17/23 0100  ceFEPIme (MAXIPIME) 2 g in sodium chloride 0.9 % 100 mL IVPB        2 g 200 mL/hr over 30 Minutes Intravenous  Once 01/17/23 0057 01/17/23 0256       Skin assessment:       Nutritional status: Body mass index is 25.37 kg/m.          Objective: Vitals:   01/26/23 1100 01/26/23 1200  BP:    Pulse:    Resp: (!) 26 (!) 25  Temp:    SpO2:      Intake/Output Summary (Last 24 hours) at 01/26/2023 1349 Last data filed at 01/25/2023 1700 Gross per 24 hour  Intake 960 ml  Output 900 ml  Net 60 ml   Filed Weights   01/18/23 0600 01/19/23 0500 01/24/23 0500  Weight: 84.1 kg 83.2 kg 80.2 kg   Weight change:  Body mass index is 25.37 kg/m.   Physical Exam: General exam: middle-aged African-American male Skin: No rashes, lesions or ulcers. HEENT: Atraumatic, normocephalic, no obvious bleeding Lungs: Clear to auscultation bilaterally CVS: Regular rate and rhythm, no murmur GI/Abd soft, nontender, nondistended, bowel sound present CNS: Alert, awake, oriented x  3 Psychiatry: Aggressive personality and tone of his voice Extremities: Mild bilateral pedal edema, chronic bilateral stasis changes  Data Review: I have personally reviewed the laboratory data and studies available.  F/u labs ordered Unresulted Labs (From admission, onward)     Start     Ordered   01/24/23 0500  CBC  Daily,   R     Question:  Specimen collection method  Answer:  Lab=Lab collect   01/23/23 0608   01/24/23 [id number]  Basic metabolic panel  Daily,   R     Question:  Specimen collection method  Answer:  Lab=Lab collect   01/23/23 0955            Total time spent in review of labs and imaging, patient evaluation, formulation of plan, documentation and communication with family: 17 minutes  Signed, Terrilee Croak, MD [facility] 01/26/2023

## 2023-01-26 NOTE — Progress Notes (Signed)
Mobility Specialist Progress Note    01/26/23 1426  Mobility  Activity Ambulated with assistance in hallway  Level of Assistance Contact guard assist, steadying assist  Assistive Device Crutches  Distance Ambulated (ft) 180 ft  Activity Response Tolerated well  Mobility Referral Yes  $Mobility charge 1 Mobility   Pre-Mobility: 87 HR During Mobility: 107 HR Post-Mobility: 99 HR  Pt received in bed and agreeable. Pt not bearing any weight through LLE. C/o pain in LLE and swelling in RLE. Returned to bed with call bell in reach.   Hildred Alamin Mobility Specialist  Please Psychologist, sport and exercise or Rehab Office at [phone]

## 2023-01-26 NOTE — Plan of Care (Signed)
  Problem: Activity: Goal: Risk for activity intolerance will decrease Outcome: Progressing   Problem: Coping: Goal: Level of anxiety will decrease Outcome: Progressing   Problem: Skin Integrity: Goal: Risk for impaired skin integrity will decrease Outcome: Progressing   

## 2023-01-26 NOTE — TOC Transition Note (Signed)
Pt didn't go home over the week-end. Discharge meds (2) are now stored at [facility].

## 2023-01-26 NOTE — Plan of Care (Signed)
  Problem: Education: Goal: Knowledge of General Education information will improve Description: Including pain rating scale, medication(s)/side effects and non-pharmacologic comfort measures Outcome: Progressing   Problem: Health Behavior/Discharge Planning: Goal: Ability to manage health-related needs will improve Outcome: Progressing   Problem: Clinical Measurements: Goal: Will remain free from infection Outcome: Progressing   

## 2023-01-27 ENCOUNTER — Inpatient Hospital Stay (HOSPITAL_COMMUNITY): Payer: Medicaid Other

## 2023-01-27 DIAGNOSIS — L03115 Cellulitis of right lower limb: Secondary | ICD-10-CM

## 2023-01-27 LAB — CBC
HCT: 28.3 % — ABNORMAL LOW (ref 39.0–52.0)
Hemoglobin: 9.5 g/dL — ABNORMAL LOW (ref 13.0–17.0)
MCH: 19 pg — ABNORMAL LOW (ref 26.0–34.0)
MCHC: 33.6 g/dL (ref 30.0–36.0)
MCV: 56.7 fL — ABNORMAL LOW (ref 80.0–100.0)
Platelets: 415 10*3/uL — ABNORMAL HIGH (ref 150–400)
RBC: 4.99 MIL/uL (ref 4.22–5.81)
RDW: 19.2 % — ABNORMAL HIGH (ref 11.5–15.5)
WBC: 11.4 10*3/uL — ABNORMAL HIGH (ref 4.0–10.5)
nRBC: 0 % (ref 0.0–0.2)

## 2023-01-27 LAB — BASIC METABOLIC PANEL
Anion gap: 10 (ref 5–15)
BUN: 15 mg/dL (ref 6–20)
CO2: 22 mmol/L (ref 22–32)
Calcium: 8.7 mg/dL — ABNORMAL LOW (ref 8.9–10.3)
Chloride: 101 mmol/L (ref 98–111)
Creatinine, Ser: 0.94 mg/dL (ref 0.61–1.24)
GFR, Estimated: >60 mL/min (ref >60)
Glucose, Bld: 88 mg/dL (ref 70–99)
Potassium: 4.4 mmol/L (ref 3.5–5.1)
Sodium: 133 mmol/L — ABNORMAL LOW (ref 135–145)

## 2023-01-27 MED ORDER — AMLODIPINE BESYLATE 5 MG PO TABS
5.0000 mg | ORAL_TABLET | Freq: Once | ORAL | Status: AC
  Administered 2023-01-27: 5 mg via ORAL
  Filled 2023-01-27: qty 1

## 2023-01-27 MED ORDER — LISINOPRIL 20 MG PO TABS: 20.0000 mg | ORAL_TABLET | Freq: Every day | ORAL | Status: DC

## 2023-01-27 MED ORDER — AMLODIPINE BESYLATE 10 MG PO TABS
10.0000 mg | ORAL_TABLET | Freq: Every day | ORAL | Status: DC
  Administered 2023-01-28: 10 mg via ORAL
  Filled 2023-01-27: qty 1

## 2023-01-27 NOTE — Progress Notes (Signed)
PROGRESS NOTE  Timothy Mcdowell  DOB: 04/08/1971  PCP: Renee Rival, FNP [id number]  DOA: 01/16/2023  LOS: 10 days  Hospital Day: 12  Brief narrative: Timothy Mcdowell is a 52 y.o. male with PMH significant for essential hypertension, chronic smoking 2/9, patient presented to the ED with complaint of progressively worsening swelling and pain of the right lower leg for 2 weeks.   Patient states that in December, he stepped over nail.  The puncture wound got worse and he presented to the ED on 1/5.  X-ray and MRI at the time showed cellulitis without osteomyelitis.  Patient was treated with IV antibiotics and discharged home on oral antibiotics.  Despite completion of course, pain persisted and progressively worsened hence patient returned back to ED on 2/9.  In the ED patient was afebrile, tachycardic mildly Labs showed leukocytosis, AKI and metabolic acidosis X-ray showed cellulitis and no evidence of osteomyelitis. Patient was admitted with sepsis secondary to cellulitis along with AKI and anemia. 2/13, MRI right foot was obtained which again ruled out osteomyelitis or drainable abscess. Seen by Dr. Sharol Given 2/14.  No surgical intervention recommended. See below for details.  Subjective: Patient was seen and examined this morning.  Sitting up at the edge of the bed. Patient has very aggressive and rude tone to his voice.  He directly blamed me for all his problems that started December.  He is very much fixated on the idea of 'premature discharge' last admission.  I told him that we are waiting for ABI to rule out peripheral artery disease.  He would not listen and hence could not carry conversation any longer this morning. Chart reviewed.  Hemodynamically stable  Assessment and plan: Severe sepsis secondary to right lower extremity cellulitis - POA No evidence of osteomyelitis and imagings as above. Seen by Dr. Sharol Given.  No need of surgical intervention. Currently on oral Zyvox 600 Mg  p.o. twice daily and Keflex 500 Mg p.o. 3 times daily. WBC count improving as below. On exam, he seems to have chronic skin changes.  I suspect peripheral artery disease.  Pending ABI today. Continue pain control with Norco as needed. Recent Labs  Lab 01/22/23 0436 01/24/23 0332 01/25/23 0416 01/26/23 0456 01/27/23 0237  WBC 18.5* 13.9* 11.6* 13.1* 11.4*   AKI Acute metabolic acidosis Present with creatinine significantly elevated to 5.15 in the setting of infection..  Creatinine down trended to normal. Serum bicarb level improved to normal as well. Recent Labs    01/17/23 0422 01/18/23 0457 01/19/23 0344 01/20/23 0155 01/21/23 0331 01/22/23 0436 01/24/23 0332 01/25/23 0416 01/26/23 0456 01/27/23 0237  BUN 54* 40* 28* 16 12 9 8 13 13 15  $ CREATININE 4.96* 2.85* 1.81* 1.30* 1.15 1.08 0.96 0.98 1.04 0.94  CO2 17* 18* 20* 21* 22 23 22 23 23 22     $ Chronic microcytic anemia Ferritin level adequate but significant microcytosis noted.  Probably thalassemia trait. No active bleeding.  Hemoglobin currently stable between 9 and 10. Recent Labs    12/13/22 0816 12/14/22 0205 01/17/23 0422 01/18/23 0322 01/22/23 0436 01/24/23 0332 01/25/23 0416 01/26/23 0456 01/27/23 0237  HGB 12.4*   < > 10.0*   < > 8.4* 9.2* 8.9* 9.8* 9.5*  MCV 59.6*   < > 57.7*   < > 55.2* 56.5* 55.3* 56.2* 56.7*  FERRITIN 208  --  1,051*  --   --   --   --   --   --   TIBC  --   --  179*  --   --   --   --   --   --   IRON  --   --  23*  --   --   --   --   --   --   RETICCTPCT  --   --  0.6  --   --   --   --   --   --    < > = values in this interval not displayed.   Essential hypertension Currently on amlodipine 5 mg daily.  Blood pressure elevated to 150s this morning.  Hydralazine IV as needed.  Chronic tobacco abuse Counseled to quit.  Nicotine patch offered.  Hyponatremia Mildly low sodium level, stable. Recent Labs  Lab 01/21/23 0331 01/22/23 0436 01/24/23 0332 01/25/23 0416  01/26/23 0456 01/27/23 0237  NA 134* 132* 133* 134* 133* 133*   Mobility: Encourage ambulation  Goals of care   Code Status: Full Code     DVT prophylaxis:  heparin injection 5,000 Units Start: 01/17/23 0600   Antimicrobials: Oral Zyvox and Keflex Fluid: None Consultants: None at this time Family Communication: None at bedside  Status is: Inpatient Level of care: Telemetry Medical   Dispo: Patient is from: Home              Anticipated d/c is to: Pending clinical course Continue in-hospital care because: Pending ABI   Scheduled Meds:  amLODipine  5 mg Oral Daily   heparin  5,000 Units Subcutaneous Q8H   lisinopril  20 mg Oral Daily   polyethylene glycol  17 g Oral Daily   senna  1 tablet Oral Daily    PRN meds: acetaminophen **OR** acetaminophen, HYDROcodone-acetaminophen, HYDROmorphone (DILAUDID) injection, melatonin, naLOXone (NARCAN)  injection, nicotine, ondansetron (ZOFRAN) IV   Infusions:    Diet:  Diet Order             Diet regular Room service appropriate? Yes; Fluid consistency: Thin  Diet effective now                   Antimicrobials: Anti-infectives (From admission, onward)    Start     Dose/Rate Route Frequency Ordered Stop   01/24/23 1000  linezolid (ZYVOX) tablet 600 mg        600 mg Oral Every 12 hours 01/23/23 1457 01/26/23 2118   01/24/23 0600  cephALEXin (KEFLEX) capsule 500 mg        500 mg Oral Every 8 hours 01/23/23 1457 01/26/23 2117   01/24/23 0000  linezolid (ZYVOX) 600 MG tablet        600 mg Oral Every 12 hours 01/23/23 1457 01/27/23 2359   01/23/23 0000  cephALEXin (KEFLEX) 500 MG capsule        500 mg Oral 3 times daily 01/23/23 1457 01/26/23 2359   01/21/23 1000  ceFAZolin (ANCEF) IVPB 2g/100 mL premix        2 g 200 mL/hr over 30 Minutes Intravenous Every 8 hours 01/21/23 0945 01/23/23 2254   01/20/23 1819  ceFEPIme (MAXIPIME) 2 g in sodium chloride 0.9 % 100 mL IVPB  Status:  Discontinued        2 g 200 mL/hr  over 30 Minutes Intravenous Every 8 hours 01/20/23 1104 01/21/23 0945   01/19/23 1000  ceFEPIme (MAXIPIME) 2 g in sodium chloride 0.9 % 100 mL IVPB  Status:  Discontinued        2 g 200 mL/hr over 30 Minutes Intravenous Every 12 hours  01/19/23 0738 01/20/23 1104   01/17/23 2200  ceFEPIme (MAXIPIME) 2 g in sodium chloride 0.9 % 100 mL IVPB  Status:  Discontinued        2 g 200 mL/hr over 30 Minutes Intravenous Every 24 hours 01/17/23 0245 01/19/23 0738   01/17/23 1600  linezolid (ZYVOX) IVPB 600 mg        600 mg 300 mL/hr over 60 Minutes Intravenous Every 12 hours 01/17/23 1507 01/23/23 2359   01/17/23 0245  vancomycin variable dose per unstable renal function (pharmacist dosing)  Status:  Discontinued         Does not apply See admin instructions 01/17/23 0245 01/17/23 1507   01/17/23 0100  vancomycin (VANCOREADY) IVPB 1500 mg/300 mL        1,500 mg 150 mL/hr over 120 Minutes Intravenous  Once 01/17/23 0057 01/17/23 0354   01/17/23 0100  ceFEPIme (MAXIPIME) 2 g in sodium chloride 0.9 % 100 mL IVPB        2 g 200 mL/hr over 30 Minutes Intravenous  Once 01/17/23 0057 01/17/23 0256       Skin assessment:       Nutritional status: Body mass index is 25.37 kg/m.          Objective: Vitals:   01/27/23 0522 01/27/23 0757  BP: (!) 146/86 (!) 151/87  Pulse: 83 83  Resp: 16   Temp: 98.3 F (36.8 C) 98 F (36.7 C)  SpO2: 98% 98%    Intake/Output Summary (Last 24 hours) at 01/27/2023 1236 Last data filed at 01/27/2023 1100 Gross per 24 hour  Intake --  Output 400 ml  Net -400 ml   Filed Weights   01/18/23 0600 01/19/23 0500 01/24/23 0500  Weight: 84.1 kg 83.2 kg 80.2 kg   Weight change:  Body mass index is 25.37 kg/m.   Physical Exam: General exam: middle-aged African-American male.  Skin: No rashes, lesions or ulcers. HEENT: Atraumatic, normocephalic, no obvious bleeding Lungs: Clear to auscultation bilaterally CVS: Regular rate and rhythm, no murmur GI/Abd  soft, nontender, nondistended, bowel sound present CNS: Alert, awake, oriented x 3 Psychiatry: Aggressive personality and a rude tone of his voice Extremities: Mild bilateral pedal edema, chronic bilateral stasis changes. Shiny skin with feeble pulses  Data Review: I have personally reviewed the laboratory data and studies available.  F/u labs ordered Unresulted Labs (From admission, onward)     Start     Ordered   01/24/23 0500  CBC  Daily,   R     Question:  Specimen collection method  Answer:  Lab=Lab collect   01/23/23 0608   01/24/23 [id number]  Basic metabolic panel  Daily,   R     Question:  Specimen collection method  Answer:  Lab=Lab collect   01/23/23 0955            Total time spent in review of labs and imaging, patient evaluation, formulation of plan, documentation and communication with family: 81 minutes  Signed, Terrilee Croak, MD [facility] 01/27/2023

## 2023-01-28 ENCOUNTER — Other Ambulatory Visit (HOSPITAL_COMMUNITY): Payer: Self-pay

## 2023-01-28 LAB — BASIC METABOLIC PANEL
Anion gap: 6 (ref 5–15)
BUN: 18 mg/dL (ref 6–20)
CO2: 26 mmol/L (ref 22–32)
Calcium: 8.7 mg/dL — ABNORMAL LOW (ref 8.9–10.3)
Chloride: 101 mmol/L (ref 98–111)
Creatinine, Ser: 0.98 mg/dL (ref 0.61–1.24)
GFR, Estimated: >60 mL/min (ref >60)
Glucose, Bld: 112 mg/dL — ABNORMAL HIGH (ref 70–99)
Potassium: 4.3 mmol/L (ref 3.5–5.1)
Sodium: 133 mmol/L — ABNORMAL LOW (ref 135–145)

## 2023-01-28 LAB — CBC
HCT: 28.9 % — ABNORMAL LOW (ref 39.0–52.0)
Hemoglobin: 9.6 g/dL — ABNORMAL LOW (ref 13.0–17.0)
MCH: 18.9 pg — ABNORMAL LOW (ref 26.0–34.0)
MCHC: 33.2 g/dL (ref 30.0–36.0)
MCV: 56.9 fL — ABNORMAL LOW (ref 80.0–100.0)
Platelets: 451 10*3/uL — ABNORMAL HIGH (ref 150–400)
RBC: 5.08 MIL/uL (ref 4.22–5.81)
RDW: 19.1 % — ABNORMAL HIGH (ref 11.5–15.5)
WBC: 9.3 10*3/uL (ref 4.0–10.5)
nRBC: 0 % (ref 0.0–0.2)

## 2023-01-28 MED ORDER — CEPHALEXIN 500 MG PO CAPS
500.0000 mg | ORAL_CAPSULE | Freq: Three times a day (TID) | ORAL | 0 refills | Status: AC
  Filled 2023-01-28: qty 15, 5d supply, fill #0

## 2023-01-28 MED ORDER — HYDROCODONE-ACETAMINOPHEN 5-325 MG PO TABS: 1.0000 | ORAL_TABLET | ORAL | Status: DC | PRN

## 2023-01-28 MED ORDER — HYDROCODONE-ACETAMINOPHEN 5-325 MG PO TABS
1.0000 | ORAL_TABLET | Freq: Four times a day (QID) | ORAL | 0 refills | Status: AC | PRN
  Filled 2023-01-28: qty 20, 5d supply, fill #0

## 2023-01-28 MED ORDER — TRIAMCINOLONE ACETONIDE 0.1 % EX CREA: 1.0000 | TOPICAL_CREAM | Freq: Two times a day (BID) | CUTANEOUS | 2 refills | Status: AC

## 2023-01-28 MED ORDER — SACCHAROMYCES BOULARDII 250 MG PO CAPS
250.0000 mg | ORAL_CAPSULE | Freq: Two times a day (BID) | ORAL | 0 refills | Status: AC
  Filled 2023-01-28: qty 10, 5d supply, fill #0

## 2023-01-28 NOTE — Discharge Summary (Addendum)
Physician Discharge Summary  Timothy Mcdowell [id number] DOB: 07/15/1971 DOA: 01/16/2023  PCP: Renee Rival, FNP  Admit date: 01/16/2023 Discharge date: 01/28/2023  Admitted From: Home Discharge disposition: Home  Recommendations at discharge:  Ensure compliance with 5 more days of antibiotics with probiotics. Judicious use of pain medicines `  Brief narrative: Timothy Mcdowell is a 52 y.o. male with PMH significant for essential hypertension, chronic smoking 2/9, patient presented to the ED with complaint of progressively worsening swelling and pain of the right lower leg for 2 weeks.   Patient states that in December, he stepped over nail.  The puncture wound got worse and he presented to the ED on 1/5.  X-ray and MRI at the time showed cellulitis without osteomyelitis.  Patient was treated with IV antibiotics and discharged home on oral antibiotics.  Despite completion of course, pain persisted and progressively worsened hence patient returned back to ED on 2/9.  In the ED patient was afebrile, tachycardic mildly Labs showed leukocytosis, AKI and metabolic acidosis X-ray showed cellulitis and no evidence of osteomyelitis. Patient was admitted with sepsis secondary to cellulitis along with AKI and anemia. 2/13, MRI right foot was obtained which again ruled out osteomyelitis or drainable abscess. Seen by Dr. Sharol Given 2/14.  No surgical intervention recommended. See below for details.  Subjective: Patient was seen and examined this morning with the charge nurse Sitting up in bed.  Patient is more controlled on his tone today.  He showed me an area of induration in the lateral aspect of left leg and he states that he is only area hurting today.  Overall feels better and feels ready for discharge today.   Assessment and plan: Severe sepsis secondary to right lower extremity cellulitis - POA No evidence of osteomyelitis and imagings as above. Seen by Dr. Sharol Given.  No need of surgical  intervention. Currently on oral Zyvox 600 Mg p.o. twice daily and Keflex 500 Mg p.o. 3 times daily. WBC count improving as below. Cellulitis overall has improved.  But there is still an area of induration in the lateral aspect of the left leg.  I will discharge him on Keflex 3 times daily for next 5 days with probiotics. On exam, he seems to have chronic skin changes.  ABI negative for peripheral artery disease. Continue pain control with Norco as needed.  Prescription for limited supply given. Recent Labs  Lab 01/24/23 0332 01/25/23 0416 01/26/23 0456 01/27/23 0237 01/28/23 0230  WBC 13.9* 11.6* 13.1* 11.4* 9.3   AKI Acute metabolic acidosis Present with creatinine significantly elevated to 5.15 in the setting of infection..  Creatinine down trended to normal. Serum bicarb level improved to normal as well. Recent Labs    01/18/23 0457 01/19/23 0344 01/20/23 0155 01/21/23 0331 01/22/23 0436 01/24/23 0332 01/25/23 0416 01/26/23 0456 01/27/23 0237 01/28/23 0230  BUN 40* 28* 16 12 9 8 13 13 15 18  $ CREATININE 2.85* 1.81* 1.30* 1.15 1.08 0.96 0.98 1.04 0.94 0.98  CO2 18* 20* 21* 22 23 22 23 23 22 26     $ Chronic microcytic anemia Ferritin level adequate but significant microcytosis noted.  Probably thalassemia trait. No active bleeding.  Hemoglobin currently stable between 9 and 10. Recent Labs    12/13/22 0816 12/14/22 0205 01/17/23 0422 01/18/23 0322 01/24/23 0332 01/25/23 0416 01/26/23 0456 01/27/23 0237 01/28/23 0230  HGB 12.4*   < > 10.0*   < > 9.2* 8.9* 9.8* 9.5* 9.6*  MCV 59.6*   < > 57.7*   < >  56.5* 55.3* 56.2* 56.7* 56.9*  FERRITIN 208  --  1,051*  --   --   --   --   --   --   TIBC  --   --  179*  --   --   --   --   --   --   IRON  --   --  23*  --   --   --   --   --   --   RETICCTPCT  --   --  0.6  --   --   --   --   --   --    < > = values in this interval not displayed.   Essential hypertension Continue amlodipine 5 mg daily and lisinopril 20 mg  daily.  Chronic tobacco abuse Counseled to quit.  Nicotine patch offered.  Hyponatremia Mildly low sodium level, stable. Recent Labs  Lab 01/22/23 0436 01/24/23 0332 01/25/23 0416 01/26/23 0456 01/27/23 0237 01/28/23 0230  NA 132* 133* 134* 133* 133* 133*   Mobility: Encourage ambulation  Goals of care   Code Status: Full Code   Wounds:  -    Discharge Exam:   Vitals:   01/27/23 1432 01/27/23 2144 01/28/23 0748 01/28/23 1342  BP: (!) 149/66 (!) 161/90 (!) 162/82 (!) 145/84  Pulse: 95 85 93 95  Resp: 18 20  18  $ Temp: 98 F (36.7 C) 98.5 F (36.9 C) 98 F (36.7 C) 98 F (36.7 C)  TempSrc: Oral Oral Oral Oral  SpO2: 99% 100% 98% 100%  Weight:      Height:        Body mass index is 25.37 kg/m.   General exam: middle-aged African-American male.  Skin: No rashes, lesions or ulcers. HEENT: Atraumatic, normocephalic, no obvious bleeding Lungs: Clear to auscultation bilaterally CVS: Regular rate and rhythm, no murmur GI/Abd soft, nontender, nondistended, bowel sound present CNS: Alert, awake, oriented x 3 Psychiatry: Aggressive personality and a rude tone of his voice Extremities: Mild bilateral pedal edema, chronic bilateral stasis changes.  Small area of induration noted in the lateral aspect of the left leg.  Follow ups:    Follow-up Information     Renee Rival, FNP Follow up.   Specialty: Nurse Practitioner Contact information: [address] [place] [address] [place] [address] [phone]         Renee Rival, FNP Follow up.   Specialty: Nurse Practitioner Contact information: [address] [place] [address] [place] [address] [phone]                 Discharge Instructions:   Discharge Instructions     Call MD for:  difficulty breathing, headache or visual disturbances   Complete by: As directed    Call MD for:  extreme fatigue   Complete by: As directed    Call MD for:  hives   Complete  by: As directed    Call MD for:  persistant dizziness or light-headedness   Complete by: As directed    Call MD for:  persistant nausea and vomiting   Complete by: As directed    Call MD for:  severe uncontrolled pain   Complete by: As directed    Call MD for:  temperature >100.4   Complete by: As directed    Diet general   Complete by: As directed    Discharge instructions   Complete by: As directed    Recommendations at discharge:  Ensure compliance with 5 more days of antibiotics with probiotics.  Judicious use of pain medicines  General discharge instructions: Follow with Primary MD Renee Rival, FNP in 7 days  Please request your PCP  to go over your hospital tests, procedures, radiology results at the follow up. Please get your medicines reviewed and adjusted.  Your PCP may decide to repeat certain labs or tests as needed. Do not drive, operate heavy machinery, perform activities at heights, swimming or participation in water activities or provide baby sitting services if your were admitted for syncope or siezures until you have seen by Primary MD or a Neurologist and advised to do so again. [place] Controlled Substance Reporting System database was reviewed. Do not drive, operate heavy machinery, perform activities at heights, swim, participate in water activities or provide baby-sitting services while on medications for pain, sleep and mood until your outpatient physician has reevaluated you and advised to do so again.  You are strongly recommended to comply with the dose, frequency and duration of prescribed medications. Activity: As tolerated with Full fall precautions use walker/cane & assistance as needed Avoid using any recreational substances like cigarette, tobacco, alcohol, or non-prescribed drug. If you experience worsening of your admission symptoms, develop shortness of breath, life threatening emergency, suicidal or homicidal thoughts you must seek  medical attention immediately by calling 911 or calling your MD immediately  if symptoms less severe. You must read complete instructions/literature along with all the possible adverse reactions/side effects for all the medicines you take and that have been prescribed to you. Take any new medicine only after you have completely understood and accepted all the possible adverse reactions/side effects.  Wear Seat belts while driving. You were cared for by a hospitalist during your hospital stay. If you have any questions about your discharge medications or the care you received while you were in the hospital after you are discharged, you can call the unit and ask to speak with the hospitalist or the covering physician. Once you are discharged, your primary care physician will handle any further medical issues. Please note that NO REFILLS for any discharge medications will be authorized once you are discharged, as it is imperative that you return to your primary care physician (or establish a relationship with a primary care physician if you do not have one).   Increase activity slowly   Complete by: As directed        Discharge Medications:   Allergies as of 01/28/2023       Reactions   Other Rash   strawberries        Medication List     STOP taking these medications    acetaminophen 500 MG tablet Commonly known as: TYLENOL       TAKE these medications    amLODipine 5 MG tablet Commonly known as: NORVASC Take 1 tablet (5 mg total) by mouth daily.   cephALEXin 500 MG capsule Commonly known as: KEFLEX Take 1 capsule (500 mg total) by mouth 3 (three) times daily for 5 days.   HYDROcodone-acetaminophen 5-325 MG tablet Commonly known as: NORCO/VICODIN Take 1 tablet by mouth every 6 (six) hours as needed for up to 5 days for moderate pain or severe pain.   hydrOXYzine 25 MG capsule Commonly known as: VISTARIL Take 1 capsule (25 mg total) by mouth every 8 (eight) hours as needed  for itching.   ibuprofen 600 MG tablet Commonly known as: ADVIL Take 1 tablet (600 mg total) by  mouth every 8 (eight) hours as needed.   lisinopril 20 MG tablet Commonly known as: ZESTRIL Take 1 tablet (20 mg total) by mouth daily.   saccharomyces boulardii 250 MG capsule Commonly known as: FLORASTOR Take 1 capsule (250 mg total) by mouth 2 (two) times daily for 5 days.   triamcinolone cream 0.1 % Commonly known as: KENALOG Apply 1 Application topically 2 (two) times daily.         The results of significant diagnostics from this hospitalization (including imaging, microbiology, ancillary and laboratory) are listed below for reference.    Procedures and Diagnostic Studies:   US RENAL  Result Date: 01/17/2023 CLINICAL DATA:  Acute kidney injury EXAM: RENAL / URINARY TRACT ULTRASOUND COMPLETE COMPARISON:  CT abdomen 03/28/2017 FINDINGS: Right Kidney: Renal measurements: 10.2 x 5.2 x 6.3 cm = volume: 176 mL. Echogenicity within normal limits. No mass or hydronephrosis visualized. Left Kidney: Renal measurements: 10.5 x 6.5 x 5.3 cm = volume: 189 mL. Echogenicity within normal limits. No mass or hydronephrosis visualized. Bladder: Appears normal for degree of bladder distention. Other: None. IMPRESSION: 1. Normal renal ultrasound. Electronically Signed   By: Kathreen Devoid M.D.   On: 01/17/2023 09:57   DG Foot Complete Right  Result Date: 01/17/2023 CLINICAL DATA:  Penetrating injury with a nail. Increasing swelling. EXAM: RIGHT FOOT COMPLETE - 3+ VIEW COMPARISON:  Study of 12/12/2022 FINDINGS: There is artifact from an overlying sock. Severe edema continues to be seen, greatest superiorly. A small accessory os supranaviculare is again noted on the lateral view and an os trigonum posteriorly. There is mild hallux valgus with otherwise normal interosseous alignment. Mild features of midfoot arthrosis. No foreign body is seen. There is no visible soft tissue gas. Left orbital fracture,  dislocation or destructive bone lesions. IMPRESSION: 1. Severe soft tissue edema without evidence of soft tissue gas or appreciable foreign body. 2. No acute osseous findings. Electronically Signed   By: Telford Nab M.D.   On: 01/17/2023 00:23   DG Chest 2 View  Result Date: 01/17/2023 CLINICAL DATA:  Shortness of breath. EXAM: CHEST - 2 VIEW COMPARISON:  Chest radiograph dated 06/03/2022. FINDINGS: No focal consolidation, pleural effusion, pneumothorax. Top-normal cardiac size. No acute osseous pathology. IMPRESSION: No active cardiopulmonary disease. Electronically Signed   By: Anner Crete M.D.   On: 01/17/2023 00:16     Labs:   Basic Metabolic Panel: Recent Labs  Lab 01/24/23 0332 01/25/23 0416 01/26/23 0456 01/27/23 0237 01/28/23 0230  NA 133* 134* 133* 133* 133*  K 4.2 4.4 4.5 4.4 4.3  CL 102 100 101 101 101  CO2 22 23 23 22 26  $ GLUCOSE 87 106* 97 88 112*  BUN 8 13 13 15 18  $ CREATININE 0.96 0.98 1.04 0.94 0.98  CALCIUM 8.5* 8.6* 8.9 8.7* 8.7*   GFR Estimated Creatinine Clearance: 91 mL/min (by C-G formula based on SCr of 0.98 mg/dL). Liver Function Tests: No results for input(s): "AST", "ALT", "ALKPHOS", "BILITOT", "PROT", "ALBUMIN" in the last 168 hours. No results for input(s): "LIPASE", "AMYLASE" in the last 168 hours. No results for input(s): "AMMONIA" in the last 168 hours. Coagulation profile No results for input(s): "INR", "PROTIME" in the last 168 hours.  CBC: Recent Labs  Lab 01/24/23 0332 01/25/23 0416 01/26/23 0456 01/27/23 0237 01/28/23 0230  WBC 13.9* 11.6* 13.1* 11.4* 9.3  HGB 9.2* 8.9* 9.8* 9.5* 9.6*  HCT 27.7* 26.1* 29.2* 28.3* 28.9*  MCV 56.5* 55.3* 56.2* 56.7* 56.9*  PLT 287 340 427*  415* 451*   Cardiac Enzymes: No results for input(s): "CKTOTAL", "CKMB", "CKMBINDEX", "TROPONINI" in the last 168 hours. BNP: Invalid input(s): "POCBNP" CBG: No results for input(s): "GLUCAP" in the last 168 hours. D-Dimer No results for input(s):  "DDIMER" in the last 72 hours. Hgb A1c No results for input(s): "HGBA1C" in the last 72 hours. Lipid Profile No results for input(s): "CHOL", "HDL", "LDLCALC", "TRIG", "CHOLHDL", "LDLDIRECT" in the last 72 hours. Thyroid function studies No results for input(s): "TSH", "T4TOTAL", "T3FREE", "THYROIDAB" in the last 72 hours.  Invalid input(s): "FREET3" Anemia work up No results for input(s): "VITAMINB12", "FOLATE", "FERRITIN", "TIBC", "IRON", "RETICCTPCT" in the last 72 hours. Microbiology No results found for this or any previous visit (from the past 240 hour(s)).  Time coordinating discharge: 35 minutes  Signed: Chestina Komatsu  [facility] 01/28/2023, 5:13 PM

## 2023-01-28 NOTE — Progress Notes (Signed)
Patient alert and oriented, verbalized understanding of dc instructions. Toc meds givne to patient. RN Claiborne Billings to clarify kennalog cream order and will notify md

## 2023-01-29 ENCOUNTER — Telehealth: Payer: Self-pay

## 2023-01-29 NOTE — Transitions of Care (Post Inpatient/ED Visit) (Signed)
   01/29/2023  Name: Timothy Mcdowell MRN: [id number] DOB: 11-27-1971  Today's TOC FU Call Status: Today's TOC FU Call Status:: Unsuccessul Call (1st Attempt) Unsuccessful Call (1st Attempt) Date: 01/29/23  Attempted to reach the patient regarding the most recent Inpatient/ED visit.  Follow Up Plan: Additional outreach attempts will be made to reach the patient to complete the Transitions of Care (Post Inpatient/ED visit) call.   [place] LPN [place] Advisor Direct Dial [phone]

## 2023-02-02 NOTE — Transitions of Care (Post Inpatient/ED Visit) (Unsigned)
   02/02/2023  Name: Timothy Mcdowell MRN: [id number] DOB: 02/07/1971  Today's TOC FU Call Status: Today's TOC FU Call Status:: Unsuccessful Call (2nd Attempt) Unsuccessful Call (1st Attempt) Date: 01/29/23 Unsuccessful Call (2nd Attempt) Date: 02/02/23  Attempted to reach the patient regarding the most recent Inpatient/ED visit.  Follow Up Plan: Additional outreach attempts will be made to reach the patient to complete the Transitions of Care (Post Inpatient/ED visit) call.   [place] LPN [place] Advisor Direct Dial [phone]

## 2023-02-04 NOTE — Transitions of Care (Post Inpatient/ED Visit) (Signed)
   02/04/2023  Name: Timothy Mcdowell MRN: [id number] DOB: 22-Oct-1971  Today's TOC FU Call Status: Today's TOC FU Call Status:: Unsuccessful Call (3rd Attempt) Unsuccessful Call (1st Attempt) Date: 01/29/23 Unsuccessful Call (2nd Attempt) Date: 02/02/23 Unsuccessful Call (3rd Attempt) Date: 02/04/23  Attempted to reach the patient regarding the most recent Inpatient/ED visit.  Follow Up Plan: No further outreach attempts will be made at this time. We have been unable to contact the patient.  [place] LPN [place] Advisor Direct Dial [phone]

## 2023-04-29 ENCOUNTER — Ambulatory Visit: Payer: Medicaid Other | Admitting: Nurse Practitioner
